# Patient Record
Sex: Female | Born: 1975 | Race: White | Hispanic: No
Health system: Midwestern US, Community
[De-identification: ages and names within clinical notes are randomized; demographics above are authoritative.]

## PROBLEM LIST (undated history)

## (undated) DIAGNOSIS — J45909 Unspecified asthma, uncomplicated: Secondary | ICD-10-CM

## (undated) DIAGNOSIS — R569 Unspecified convulsions: Secondary | ICD-10-CM

## (undated) DIAGNOSIS — K8020 Calculus of gallbladder without cholecystitis without obstruction: Secondary | ICD-10-CM

## (undated) DIAGNOSIS — I10 Essential (primary) hypertension: Secondary | ICD-10-CM

## (undated) DIAGNOSIS — I2510 Atherosclerotic heart disease of native coronary artery without angina pectoris: Secondary | ICD-10-CM

## (undated) DIAGNOSIS — D649 Anemia, unspecified: Secondary | ICD-10-CM

## (undated) HISTORY — PX: TUBAL LIGATION: SHX77

## (undated) HISTORY — PX: CAROTID STENT: SHX1301

---

## 2013-12-21 LAB — COMPREHENSIVE PANEL
ALT: 10 U/L (ref 5–33)
AST: 13 U/L (ref 5–32)
Albumin: 4.3 G/DL (ref 3.5–5.2)
Alkaline Phosphatase: 64 U/L (ref 35–104)
Anion Gap: 11 mmol/L (ref 7–19)
BUN: 15 MG/DL (ref 6–20)
CO2: 27 mmol/L (ref 22–29)
Calcium: 9.9 MG/DL (ref 8.6–10.0)
Chloride: 99 mmol/L (ref 98–111)
Creatinine: 0.8 MG/DL (ref 0.50–0.90)
Gfr Calculated: 85 mL/min (ref 59–300)
Glucose: 109 MG/DL (ref 74–109)
Potassium: 4 mmol/L (ref 3.5–5.0)
Sodium: 137 mmol/L (ref 136–145)
Total Bilirubin: <0.2 MG/DL — ABNORMAL LOW (ref 0.3–1.2)
Total Protein: 7.7 G/DL (ref 6.6–8.7)

## 2013-12-21 LAB — CBC PANEL ITEM
Basophils %: 0.5 % (ref 0–1)
Basophils: 0.05 10*3/uL (ref 0.0–0.20)
Eosinophils %: 0.13 10*3/uL (ref 0.0–0.60)
Eosinophils %: 1.2 % (ref 1–5)
Hematocrit: 37.3 % (ref 37–47)
Hemoglobin: 12 G/DL (ref 12.0–16.0)
Lymphocytes: 2.38 10*3/uL (ref 1.1–4.5)
Lymphocytes: 22.2 % (ref 20–40)
MCH: 27.7 PG (ref 27–31)
MCHC: 32.2 G/DL — ABNORMAL LOW (ref 33–37)
MCV: 86.1 FL (ref 81–99)
MPV: 9.3 FL — ABNORMAL LOW (ref 10.2–13.2)
Monocytes %: 8.4 % (ref 0–10)
Monocytes: 0.9 10*3/uL (ref 0.0–0.9)
Neutrophils %: 67.7 % (ref 50–70)
Neutrophils Absolute: 7.28 10*3/uL (ref 1.5–7.5)
Platelets: 299 10*3/uL (ref 130–400)
RBC: 4.33 MIL/uL (ref 4.2–5.4)
RDW: 14.1 % — ABNORMAL HIGH (ref 11.0–14.0)
WBC: 10.74 10*3/uL (ref 4.8–10.8)

## 2013-12-21 LAB — POCT TROPONIN
POC Troponin I: 0 NG/ML — ABNORMAL LOW (ref 0.000–0.08)
POC Troponin I: 0 NG/ML — ABNORMAL LOW (ref 0.000–0.08)

## 2013-12-21 LAB — PROTIME-INR
INR: 0.97 (ref 0.87–1.14)
Protime: 12.6 s (ref 11.8–14.6)

## 2013-12-21 LAB — APTT: PTT: 29.6 s (ref 25.5–37.8)

## 2014-08-06 DIAGNOSIS — N938 Other specified abnormal uterine and vaginal bleeding: Secondary | ICD-10-CM

## 2014-08-07 ENCOUNTER — Inpatient Hospital Stay
Admit: 2014-08-07 | Discharge: 2014-08-07 | Disposition: A | Discharge status: Home / Self Care | Payer: PRIVATE HEALTH INSURANCE

## 2014-08-07 LAB — CBC WITH AUTO DIFFERENTIAL
Basophils %: 0.4 % (ref 0.0–1.0)
Basophils Absolute: 0.1 10*3/uL (ref 0.00–0.20)
Eosinophils %: 1.5 % (ref 0.0–5.0)
Eosinophils Absolute: 0.2 10*3/uL (ref 0.00–0.60)
Hematocrit: 34 % — ABNORMAL LOW (ref 37.0–47.0)
Hemoglobin: 11 g/dL — ABNORMAL LOW (ref 12.0–16.0)
Lymphocytes %: 29 % (ref 20.0–40.0)
Lymphocytes Absolute: 3.3 10*3/uL (ref 1.1–4.5)
MCH: 27.5 pg (ref 27.0–31.0)
MCHC: 32.4 g/dL — ABNORMAL LOW (ref 33.0–37.0)
MCV: 85 fL (ref 81.0–99.0)
MPV: 9.9 fL (ref 7.4–10.4)
Monocytes %: 7.5 % (ref 0.0–10.0)
Monocytes Absolute: 0.9 10*3/uL (ref 0.00–0.90)
Neutrophils %: 61.6 % (ref 50.0–65.0)
Neutrophils Absolute: 6.9 10*3/uL (ref 1.5–7.5)
Platelets: 316 10*3/uL (ref 130–400)
RBC: 4 M/uL — ABNORMAL LOW (ref 4.20–5.40)
RDW: 13.7 % (ref 11.5–14.5)
WBC: 11.3 10*3/uL — ABNORMAL HIGH (ref 4.8–10.8)

## 2014-08-07 LAB — COMPREHENSIVE METABOLIC PANEL
ALT: 10 U/L (ref 5–33)
AST: 17 U/L (ref 5–32)
Albumin: 3.9 g/dL (ref 3.5–5.2)
Alkaline Phosphatase: 66 U/L (ref 35–104)
Anion Gap: 12 mmol/L (ref 7–19)
BUN: 13 mg/dL (ref 6–20)
CO2: 25 mmol/L (ref 22–29)
Calcium: 8.9 mg/dL (ref 8.6–10.0)
Chloride: 104 mmol/L (ref 98–111)
Creatinine: 0.8 mg/dL (ref 0.5–0.9)
GFR Non-African American: >60 (ref >60)
Globulin: 3.5 g/dL
Glucose: 112 mg/dL — ABNORMAL HIGH (ref 74–109)
Potassium: 4.1 mmol/L (ref 3.5–5.0)
Sodium: 141 mmol/L (ref 136–145)
Total Bilirubin: <0.2 mg/dL — AB (ref 0.2–1.2)
Total Protein: 7.4 g/dL (ref 6.6–8.7)

## 2014-08-07 LAB — WET PREP, GENITAL
Clue Cells, Wet Prep: NONE SEEN
Trichomonas Prep: NONE SEEN
Yeast, Wet Prep: NONE SEEN

## 2014-08-07 LAB — KOH (SKIN,HAIR,NAILS): KOH Prep: NONE SEEN

## 2014-08-07 MED FILL — ONDANSETRON 4 MG PO TBDP: 4 MG | ORAL | Qty: 1

## 2014-08-07 MED FILL — MEDROXYPROGESTERONE ACETATE 10 MG PO TABS: 10 MG | ORAL | Qty: 1

## 2014-08-07 MED FILL — MORPHINE SULFATE (PF) 4 MG/ML IV SOLN: 4 MG/ML | INTRAVENOUS | Qty: 1

## 2014-08-07 NOTE — ED Notes (Signed)
See downtime notes    Hart RochesterCynthia G Mylon Mabey, RN  08/07/14 [phone]

## 2014-08-08 LAB — C.TRACHOMATIS N.GONORRHOEAE DNA
C. Trachomatis Amplified: NEGATIVE
N. Gonorrhoeae Amplified: NEGATIVE

## 2015-09-04 ENCOUNTER — Inpatient Hospital Stay
Admission: EM | Admit: 2015-09-04 | Discharge: 2015-09-07 | Disposition: A | Discharge status: Home / Self Care | Payer: PRIVATE HEALTH INSURANCE | Source: Other Acute Inpatient Hospital | Admitting: Psychiatry

## 2015-09-04 ENCOUNTER — Emergency Department: Admit: 2015-09-04 | Payer: PRIVATE HEALTH INSURANCE

## 2015-09-04 DIAGNOSIS — F4325 Adjustment disorder with mixed disturbance of emotions and conduct: Principal | ICD-10-CM

## 2015-09-04 LAB — COMPREHENSIVE METABOLIC PANEL
ALT: 11 U/L (ref 5–33)
AST: 15 U/L (ref 5–32)
Albumin: 4.3 g/dL (ref 3.5–5.2)
Alkaline Phosphatase: 53 U/L (ref 35–104)
Anion Gap: 16 mmol/L (ref 7–19)
BUN: 19 mg/dL (ref 6–20)
CO2: 24 mmol/L (ref 22–29)
Calcium: 9.6 mg/dL (ref 8.6–10.0)
Chloride: 102 mmol/L (ref 98–111)
Creatinine: 1 mg/dL — ABNORMAL HIGH (ref 0.5–0.9)
GFR Non-African American: >60 (ref >60)
Globulin: 3.2 g/dL
Glucose: 131 mg/dL — ABNORMAL HIGH (ref 74–109)
Potassium: 3.5 mmol/L (ref 3.5–5.0)
Sodium: 142 mmol/L (ref 136–145)
Total Bilirubin: <0.2 mg/dL — AB (ref 0.2–1.2)
Total Protein: 7.5 g/dL (ref 6.6–8.7)

## 2015-09-04 LAB — CBC
Hematocrit: 36.5 % — ABNORMAL LOW (ref 37.0–47.0)
Hemoglobin: 11.7 g/dL — ABNORMAL LOW (ref 12.0–16.0)
MCH: 27.3 pg (ref 27.0–31.0)
MCHC: 32.1 g/dL — ABNORMAL LOW (ref 33.0–37.0)
MCV: 85.1 fL (ref 81.0–99.0)
MPV: 9.7 fL (ref 7.4–10.4)
Platelets: 310 10*3/uL (ref 130–400)
RBC: 4.29 M/uL (ref 4.20–5.40)
RDW: 13.4 % (ref 11.5–14.5)
WBC: 15.3 10*3/uL — ABNORMAL HIGH (ref 4.8–10.8)

## 2015-09-04 LAB — PREGNANCY, URINE: HCG(Urine) Pregnancy Test: NEGATIVE

## 2015-09-04 LAB — URINE DRUG SCREEN
Amphetamine Screen, Urine: NEGATIVE (ref <1000)
Barbiturate Screen, Ur: NEGATIVE (ref <200)
Benzodiazepine Screen, Urine: NEGATIVE (ref <100)
Cannabinoid Scrn, Ur: POSITIVE — AB (ref <50)
Cocaine Metabolite Screen, Urine: NEGATIVE (ref <300)
Opiate Scrn, Ur: NEGATIVE (ref <300)

## 2015-09-04 LAB — ETHANOL: Ethanol Lvl: <10 mg/dL

## 2015-09-04 MED ORDER — ACETAMINOPHEN 325 MG PO TABS
325 MG | Freq: Once | ORAL | Status: AC
Start: 2015-09-04 — End: 2015-09-04
  Administered 2015-09-05: 650 mg via ORAL

## 2015-09-04 NOTE — ED Notes (Signed)
Pt was refusing admission to behavioral health unit,  Explained to pt that she was having a 72 hour hold placed, pt would be in the hospital for the full 72 hours,  If pt agreed to  Go voluntary admission,  There was a chance she might not have to be in the hospital for 72 hours, it would be up to the psychiatrist. Pt agreed and signed voluntary admission form.         Andris FlurryLaura A Aedan Geimer, RN  09/04/15 2040

## 2015-09-04 NOTE — Behavioral Health Treatment Team (Signed)
Dr.Schuhmann phoned to discuss patient and reported medications.Per Dr. Roseanne Kaufman, "hold off on medications tonite, verify in AM. Trazodone was ordered in case patient could not sleep.

## 2015-09-04 NOTE — ED Notes (Signed)
Mental health intake evaluator at bedside to see pt     Andris FlurryLaura A Yailen Zemaitis, RN  09/04/15 2017
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
## 2015-09-04 NOTE — ED Notes (Signed)
Manual BP of 145/78 reported to carla, Charity fundraiserN on [facility] health. Albin FellingCarla to send a tech for pt transfer.     Nadean CorwinJessica Camry Robello, RN  09/04/15 2159

## 2015-09-04 NOTE — ED Notes (Signed)
Lana with BH notified of intake     Juleen ChinaHeather D Lougenia Morrissey  09/04/15 1903
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
## 2015-09-04 NOTE — ED Notes (Signed)
Called [facility] to report manual BP of 150/92. Informed Dr would not admit until diastolic pressure is under 80.     Nadean CorwinJessica Samiksha Pellicano, RN  09/04/15 2129

## 2015-09-04 NOTE — ED Notes (Signed)
Bed: RME02  Expected date:   Expected time:   Means of arrival:   Comments:  ems     Andris Flurry, RN  09/04/15 1705
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
## 2015-09-05 MED ORDER — IPRATROPIUM-ALBUTEROL 20-100 MCG/ACT IN AERS
20-100 MCG/ACT | RESPIRATORY_TRACT | Status: DC | PRN
Start: 2015-09-05 — End: 2015-09-07

## 2015-09-05 MED ORDER — TRAZODONE HCL 50 MG PO TABS
50 MG | Freq: Once | ORAL | Status: AC | PRN
Start: 2015-09-05 — End: 2015-09-05
  Administered 2015-09-06: 02:00:00 50 mg via ORAL

## 2015-09-05 MED ORDER — IBUPROFEN 400 MG PO TABS
400 MG | Freq: Three times a day (TID) | ORAL | Status: DC | PRN
Start: 2015-09-05 — End: 2015-09-07
  Administered 2015-09-05: 15:00:00 400 mg via ORAL

## 2015-09-05 MED ORDER — LISINOPRIL 20 MG PO TABS
20 MG | Freq: Every day | ORAL | Status: DC
Start: 2015-09-05 — End: 2015-09-07
  Administered 2015-09-05 – 2015-09-07 (×3): 20 mg via ORAL

## 2015-09-05 MED ORDER — DICLOFENAC 18 MG PO CAPS
18 MG | Freq: Two times a day (BID) | ORAL | Status: DC | PRN
Start: 2015-09-05 — End: 2015-09-05

## 2015-09-05 MED ORDER — FERROUS SULFATE 325 (65 FE) MG PO TABS
325 (65 Fe) MG | Freq: Two times a day (BID) | ORAL | Status: DC
Start: 2015-09-05 — End: 2015-09-07
  Administered 2015-09-05 – 2015-09-07 (×5): 325 mg via ORAL

## 2015-09-05 MED ORDER — CLONIDINE HCL 0.1 MG PO TABS
0.1 MG | Freq: Once | ORAL | Status: AC
Start: 2015-09-05 — End: 2015-09-04
  Administered 2015-09-05: 02:00:00 0.1 mg via ORAL

## 2015-09-05 MED ORDER — MONTELUKAST SODIUM 10 MG PO TABS
10 MG | Freq: Every evening | ORAL | Status: DC
Start: 2015-09-05 — End: 2015-09-07
  Administered 2015-09-06 – 2015-09-07 (×2): 10 mg via ORAL

## 2015-09-05 MED FILL — FERROUS SULFATE 325 (65 FE) MG PO TABS: 325 (65 Fe) MG | ORAL | Qty: 1

## 2015-09-05 MED FILL — COMBIVENT RESPIMAT 20-100 MCG/ACT IN AERS: 20-100 MCG/ACT | RESPIRATORY_TRACT | Qty: 200

## 2015-09-05 MED FILL — LISINOPRIL 20 MG PO TABS: 20 MG | ORAL | Qty: 1

## 2015-09-05 MED FILL — IBUPROFEN 400 MG PO TABS: 400 MG | ORAL | Qty: 1

## 2015-09-05 MED FILL — TYLENOL 325 MG PO TABS: 325 MG | ORAL | Qty: 2

## 2015-09-05 MED FILL — CLONIDINE HCL 0.1 MG PO TABS: 0.1 MG | ORAL | Qty: 1

## 2015-09-05 NOTE — Behavioral Health Treatment Team (Signed)
Group Therapy Note    Date: 09/05/15  Start Time: 1545   End Time:1615    Number of Participants: 14    Type of Group: Med Education   Patient's Goal:  To learn about medications and  Compliance.      Notes:      Status After Intervention:  Unchanged    Participation Level: Active Listener    Participation Quality: Appropriate    Speech:  normal    Thought Process/Content: Logical    Affective Functioning: Congruent    Mood: elevated    Level of consciousness:  Alert    Response to Learning: Able to verbalize current knowledge/experience    Modes of Intervention: Education and Support    Discipline Responsible: Registered Nurse    Signature:  Regana Kemple D Diva Lemberger, RN

## 2015-09-05 NOTE — H&P (Signed)
HISTORY and PHYSICAL      CHIEF COMPLAINT:  depression    Reason for Admission:  depression    History Obtained From:  patient    HISTORY OF PRESENT ILLNESS:      The patient is a 40 y.o. female who [facility] with worsening mood issues. She has no c/o CP or SOA. No abdominal pain or N/V. No dysuria. No recent illnesses or fevers. No c/o HA.     Past Medical History:        Diagnosis Date   ??? Asthma    ??? Hypertension      Past Surgical History:        Procedure Laterality Date   ??? CESAREAN SECTION           Medications Prior to Admission:    Prescriptions Prior to Admission: ferrous sulfate 325 (65 FE) MG tablet, Take 325 mg by mouth 2 times daily (with meals)  albuterol-ipratropium (COMBIVENT) 18-103 MCG/ACT inhaler, Inhale 2 puffs into the lungs every 4 hours as needed for Wheezing  lisinopril (PRINIVIL;ZESTRIL) 20 MG tablet, Take 20 mg by mouth daily verified  montelukast (SINGULAIR) 10 MG tablet, Take 10 mg by mouth nightly  DICLOFENAC PO, Take 75 mg by mouth 2 times daily as needed     Allergies:  Lortab [hydrocodone-acetaminophen]    Social History:   TOBACCO:   reports that she has never smoked. She does not have any smokeless tobacco history on file.  ETOH:   reports that she does not drink alcohol.  DRUGS:   reports that she does not use illicit drugs.  MARITAL STATUS:  engaged  OCCUPATION:  Not working  Patient currently lives with family       Family History:   History reviewed. No pertinent family history.  REVIEW OF SYSTEMS:  Constitutional: neg  CV: neg  Pulmonary: neg  GI: neg  GU: neg  Psych: depression, poor sleep  Neuro: neg  Skin: neg  MusculoSkeletal: neg  HEENT: neg  Joints: neg    Vitals:  BP 125/77   Pulse 82   Temp 97.7 ??F (36.5 ??C) (Temporal)    Resp 18   Ht 5' (1.524 m)   Wt 174 lb 9.6 oz (79.2 kg)   LMP 08/21/2015   SpO2 100%   BMI 34.1 kg/m2    PHYSICAL EXAM:  Gen: NAD, alert  HEENT: WNL  Lymph: no LAD  Neck: no  JVD or masses  Chest: CTA bilat  CV: RRR  Abdomen: NT/ND  Extrem: no C/C/E  Neuro: non focal  Skin: no rashes  Joints: no redness    DATA:  I have reviewed the admission labs and imaging tests.    ASSESSMENT AND PLAN:      Patient Active Hospital Problem List:  Depression----follow with Psych  HTN---monitor BP  Asthma---prn meds  Hyperglycemia--check HgA1C  THC Use  Leukocytosis          Danny LawlessAribbe A Berneda Piccininni, MD  9:48 PM 09/05/2015

## 2015-09-05 NOTE — Progress Notes (Signed)
Admission Note      Reason for admission/Target Symptom:  Diagnoses:  UDS: Negative- Benzo- Opiate- Amphetamines- THC- Cocaine- Barbs  BAL: Negative     SW met with treatment team to discuss patient treatment and follow up care plans. Pt was admitted to [facility].     Treatment team has  identified patients discharge needs as medication management and therapy with [facility]. Pt validated need for appointments. Pt was also provided a handout of contact information for drug and alcohol treatment centers and other community support service such as [facility] and [facility] .

## 2015-09-05 NOTE — Plan of Care (Signed)
Problem: Anxiety:  Goal: Level of anxiety will decrease  Level of anxiety will decrease   Outcome: Ongoing                                                                      Group Therapy Note     Date: 09/05/2015  Start Time: 1000  End Time:  1045  Number of Participants: 17     Type of Group: Psychotherapy     Patient's Goal: Patient will process emotions and actions and how to relate to other or their with others. Patient discussed open communication and feelings and emotions.     Notes:  Patient attended process group as scheduled, patient identified a issue to work on today regarding how they will interact and relate to others.      Status After Intervention:  Improved     Participation Level: Active Listener     Participation Quality: Appropriate, Attentive and Sharing        Speech:  normal        Thought Process/Content: Logical        Affective Functioning: Congruent        Mood: euthymic        Level of consciousness:  Alert        Response to Learning: Able to verbalize current knowledge/experience        Endings: None Reported     Modes of Intervention: Education, Support and Socialization        Discipline Responsible: Social Worker/Counselor        Signature:  Lovina Reach, [facility]

## 2015-09-05 NOTE — Progress Notes (Signed)
Treatment Team Note:    CSW met with [place] team to discuss Pts [place] plans.     Progress/Behavior/Group Attendance: TBD    Target Symptoms/Reason for admission: SI    Diagnoses: Depression    UDS: Neg- Benzo-Opiate- Amphetamines- THC-Cocaine- Barbs     BAL: Neg    AftercarePlan: [place] lives with: SW will meet with pt to gather information.    Collateral obtained from: SW will meet with pt to gather release of information.  On:    Family Session: TBA    Misc:

## 2015-09-05 NOTE — Behavioral Health Treatment Team (Signed)
PT OUT OF ROOM AND SOCIALIZING WITH PEERS.  REPORTS 'NO SLEEP AT ALL LAST NIGHT."   STATES SHE "OK, JUST SAD AND STRESSED OUT."  PT DOES HAVE BUMP AT THE TOP OF HER FOREHEAD.  REQUESTED IBUPROFEN FOR HEADACHE.  RATED 3/10 FOR DEPRESSION AND 0/10 FOR ANXIETY.  COMPLIANT WITH MEDS, GROUPS, AND ADL'S.  WILL CONTINUE TO MONITOR FOR SAFETY.

## 2015-09-05 NOTE — Plan of Care (Signed)
Problem: Altered Mood, Depressive Behavior  Goal: STG-Knowledge of positive coping patterns  Outcome: Ongoing                                                                      Group Therapy Note     Date: 09/05/2015  Start Time: 1500  End Time:  1545  Number of Participants: 14     Type of Group: Recovery     Wellness Binder Information  Module Name:  Relapse prevention  Session Number:  2     Patient's Goal:  Identifying early warning signs     Notes:  Pt acknowledged negative thinking as an early warning sign.     Status After Intervention:  Improved     Participation Level: Interactive     Participation Quality: Appropriate, Attentive and Sharing        Speech:  normal        Thought Process/Content: Logical        Affective Functioning: Congruent        Mood: congruent        Level of consciousness:  Alert, Oriented x4 and Attentive        Response to Learning: Able to verbalize current knowledge/experience        Endings: None Reported     Modes of Intervention: Education        Discipline Responsible: Psychoeducational Specialist        Signature:  Rona Ravens

## 2015-09-05 NOTE — Plan of Care (Signed)
Problem: Altered Mood, Depressive Behavior  Goal: STG-Knowledge of positive coping patterns  Outcome: Ongoing                                                                      Group Therapy Note     Date: 09/05/2015  Start Time: 1100  End Time:  1145  Number of Participants: 15     Type of Group: Psychoeducation     Wellness Binder Information  Module Name:  Emotional wellness  Session Number:  1     Patient's Goal:  Validation of feelings     Notes:  Pt acknowledged the way to have feelings validated is to share those feelings with others.     Status After Intervention:  Improved     Participation Level: Interactive     Participation Quality: Appropriate, Attentive and Sharing        Speech:  normal        Thought Process/Content: Logical        Affective Functioning: Congruent        Mood: congruent        Level of consciousness:  Alert, Oriented x4 and Attentive        Response to Learning: Able to verbalize current knowledge/experience        Endings: None Reported     Modes of Intervention: Education        Discipline Responsible: Psychoeducational Specialist        Signature:  Rona Ravens

## 2015-09-05 NOTE — Progress Notes (Signed)
Group Therapy Note  Start Time: 0915  End Time:  0945  Number of Participants: 16    Type of Group: Community Meeting    Patient's Goal:  Pt stated "realizing I can't help everyone".     Participation Level: Active Listener    Participation Quality: Attentive    Mood: calm    Level of consciousness:  Drowsy    Discipline Responsible: Behavioral Health Tech II    Signature:  Caleen Essex

## 2015-09-05 NOTE — Progress Notes (Signed)
Requirement Note     SW met with pt to complete Psychosocial within 72 hours, CSSRS within 24 hours, and treatment plan signature sheet within 72 hours.  SW explained treatment goals with pt. Decreasing depression and anxiety by improvement of positive coping patterns was discussed. Pt acknowledged understanding of treatment goals and signed treatment plan signature sheet.        In the last 6 months has the pt been a danger to self: YES  In the last 6 months has the pt been a danger to others: NO    Provided pt with [employer] handout entitled "Quitting Smoking."  Reviewed handout with pt addressing dangers of smoking, developing coping skills, and providing basic information about quitting.       Patient /declined practical counseling of tobacco practical counseling during the hospital stay.

## 2015-09-05 NOTE — Progress Notes (Signed)
Group Therapy Note    Date: 09/05/2015  Start Time: 2030  End Time:  2100  Number of Participants: 17    Type of Group: Wrap-Up    Wellness Binder Information  Module Name:    Session Number:      Patient's Goal:  Did not verbalize    Notes:  Educated about goals and discharge planning    Status After Intervention:  Improved    Participation Level: Active Listener    Participation Quality: Attentive      Speech:  normal      Thought Process/Content: Logical      Affective Functioning: Congruent      Mood: depressed      Level of consciousness:  Attentive      Response to Learning: Capable of insight      Endings: None Reported    Modes of Intervention: Education and Support      Discipline Responsible: Stage manager      Signature:  Sondra Come

## 2015-09-05 NOTE — H&P (Signed)
[facility]                    [address] [place], [place]  [address]                                 HISTORY AND PHYSICAL    PATIENT NAME:  POETRY, SIS.            DOB:   Jun 01, 1975  MED REC NO:    [id number]                           ROOM:  [id number]  ACCOUNT NO:    [id number]                        ADMISSION DATE: 09/04/2015  PHYSICIAN:     Elmon Kirschner, MD                  The patient is seen on 09/05/2015 at 9:50 a.m.      HISTORY OF PRESENT ILLNESS: The patient is a 40 year old divorced white  female, mother of 3, currently unemployed, who is here after she caused  self-inflicted damage by hitting her head against the concrete in a park.  The  story is that she discovered 10 days ago that her daughter who is 55 and lives  in [place], I believe, was using drugs;  specifically, cocaine and this  daughter's father kicked her out of the house.  Apparently, that daughter is  now in another place, I think in her boyfriend's mother's house and she is  safe but she was wanting the patient to go and spend some time with her and  help her and the patient and the patient's fiance were getting ready to move  there and they had a ride scheduled.  The ride did not show up and they could  not get a hold of the ride and in frustration they started arguing, they  meaning she and the fiance and the patient's oldest child who is a 74 year old  son and at some point, the patient, for some reason, started banging her head  against the concrete and the fiancee had to stop her.  Apparently, later on,  she looked pale and was not feeling well and so the boyfriend said "you have  to go to the emergency room," and he brought her.  A head CT shows no skull  fracture, nothing objectively identified in the head CT.  She denies  depression, denies suicidal ideas.  Denies hallucinations.  Reports no  psychiatric history.  No history of substance abuse.  Nevertheless, the  urine  drug screen is positive for marijuana.  She says that she does not know why  she did it, but that she will never do it again.  She is able to look at what  happened and say that she was probably frustrated to the extreme and worried  about her daughter and so on and so forth, but she very emphatically denies  suicidal ideas or any wishes to harm herself, says she has no history of  harming herself, reports no family psychiatric history.      SOCIAL HISTORY:  Born in [place]; specifically, in [place].  She has been  in [place] for 13 years.  Childhood was uneventful except she  was raised by  mother and stepfather because her father went to prison for rape, says she has  never been molested or raped.  She got out of school at the ninth grade due to  pregnancy.  Later on, she got a GED.  She goes to church off and on to the  [employer].    RESULTS:  Here include the already mentioned head CT.  Complete metabolic  profile normal, LFTs, normal.  Urine drug screen only positive for marijuana.   White blood count 15.3, H and H of 11.7 and 36.5.  Apparently, she also had a  neck CT.  No evidence of posttraumatic disk herniation.    MENTAL STATUS EXAMINATION:  Reveals a 40 year old white female with a huge  area of trauma in the middle of her forehead, in the form of swelling and so.   No apparent skin rupture or damage.  She is awake, alert, oriented,  cooperative, well related, good eye contact, normal speech, no formal thought  disorder.  No evidence of delusions or hallucinations.  No suicidal ideas,  intentions or plans.  Mood is okay.  Affect is congruent with a touch of  anxiety, but nothing unusual.  Cognition grossly intact.  Insight and judgment  good during the interview.  Impulse control good during the interview.    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    PLAN:  To admit to the unit on suicide precautions.  See Dr. Daphine Deutscher for  medical.  Obtain collateral information with proper consent.  I called the  boyfriend, but I got no response, no voicemail.  Will continue trying.  No  psychiatric medication for now.  Check [facility] report per protocol.  Estimated  length of stay 2-3 days.        Elmon Kirschner, MD      D: 09/05/2015 09:55:37  T: 09/05/2015 10:16:16  JJ/nts  Job#: [id number]  Doc#: [id number]

## 2015-09-05 NOTE — H&P (Signed)
Pt seen, H and P completed and dictated, # [id number]    J. Marga Melnick MD

## 2015-09-05 NOTE — Plan of Care (Signed)
Problem: Altered Mood, Depressive Behavior  Goal: STG-Knowledge of positive coping patterns  Outcome: Ongoing                                                                      Group Therapy Note     Date: 09/05/2015  Start Time: 1145  End Time:  1230  Number of Participants: 15     Type of Group: Physicist, medical Information  Module Name:  stress  Session Number:  1     Patient's Goal:  What causes stress     Notes:  Pt acknowledged negative thinking as a cause of stress.     Status After Intervention:  Improved     Participation Level: Interactive     Participation Quality: Appropriate, Attentive and Sharing        Speech:  normal        Thought Process/Content: Logical        Affective Functioning: Congruent        Mood: congruent        Level of consciousness:  Alert, Oriented x4 and Attentive        Response to Learning: Able to verbalize current knowledge/experience        Endings: None Reported     Modes of Intervention: Education        Discipline Responsible: Psychoeducational Specialist        Signature:  Rona Ravens

## 2015-09-06 LAB — HEMOGLOBIN A1C: Hemoglobin A1C: 5.5 %

## 2015-09-06 LAB — VITAMIN D 25 HYDROXY: Vit D, 25-Hydroxy: 10.3 ng/mL — ABNORMAL LOW (ref >=30)

## 2015-09-06 LAB — TSH: TSH: 0.89 u[IU]/mL (ref 0.27–4.20)

## 2015-09-06 LAB — VITAMIN B12: Vitamin B-12: 595 pg/mL (ref 211–946)

## 2015-09-06 MED FILL — FERROUS SULFATE 325 (65 FE) MG PO TABS: 325 (65 Fe) MG | ORAL | Qty: 1

## 2015-09-06 MED FILL — LISINOPRIL 20 MG PO TABS: 20 MG | ORAL | Qty: 1

## 2015-09-06 MED FILL — MONTELUKAST SODIUM 10 MG PO TABS: 10 MG | ORAL | Qty: 1

## 2015-09-06 MED FILL — TRAZODONE HCL 50 MG PO TABS: 50 MG | ORAL | Qty: 1

## 2015-09-06 NOTE — Progress Notes (Signed)
Alert and oriented x 4.  Denies any depression and anxiety.  Denies SI, HI, AVH.

## 2015-09-06 NOTE — Progress Notes (Signed)
PT REPORTS SHE FEELS "GOOD" TODAY AND IS FEELING MORE SOCIAL.  REPORT BETTER SLEEP AND GOOD APPETITE. DENIES SI/HI AND AVH.  RATED DEPRESSION AND ANXIETY BOTH 0/10.  COMPLIANT WITH MEDS, ADL'S AND GROUPS.  AFFECT BLUNTED WITH PLEASANT MOOD. WILL CONTINUE TO MONITOR FOR SAFETY.

## 2015-09-06 NOTE — Plan of Care (Signed)
Problem: Altered Mood, Depressive Behavior  Goal: STG-Knowledge of positive coping patterns  Outcome: Ongoing                                                                      Group Therapy Note     Date: 09/06/2015  Start Time: 1145  End Time:  1230  Number of Participants: 17     Type of Group: Physicist, medical Information  Module Name:  Social wellness  Session Number:  1     Patient's Goal:  Your support network     Notes:  Pt acknowledged family, doctor, and therapist as examples of support network.     Status After Intervention:  Improved     Participation Level: Interactive     Participation Quality: Appropriate, Attentive and Sharing        Speech:  normal        Thought Process/Content: Logical        Affective Functioning: Congruent        Mood: congruent        Level of consciousness:  Alert, Oriented x4 and Attentive        Response to Learning: Able to verbalize current knowledge/experience        Endings: None Reported     Modes of Intervention: Education        Discipline Responsible: Psychoeducational Specialist        Signature:  Rona Ravens

## 2015-09-06 NOTE — Progress Notes (Signed)
Patient alert and oriented x4.  Pleasant and cooperative with staff and peers.  Rates depression a 2/10 and anxiety a 0/10.  Med compliant, denies SI, HI and AVH

## 2015-09-06 NOTE — Progress Notes (Signed)
Placed call to get collateral from patients sister in law and there is no voice mail to leave a message [phone]

## 2015-09-06 NOTE — Progress Notes (Signed)
Group Therapy Note    Date: 08/14/2014  Start Time: 0900  End Time:  0930  Number of Participants: 16    Type of Group: Community Meeting    Patient's Goal:  Controlling my thoughts    Notes:    Patient attended group, filled out menu and goals sheet.  Continue to monitor for safety.    Status After Intervention:  Improved    Participation Level: Active Listener    Participation Quality: Appropriate    Discipline Responsible: [employer] Health Tech      Signature:  Jodi Mourning

## 2015-09-06 NOTE — Progress Notes (Signed)
Treatment Team Note:  ??  SW met with [facility] team to discuss Pts [place] plans.   ??  Progress/Behavior/Group Attendance: TBD  ??  Target Symptoms/Reason for admission: SI  ??  Diagnoses: Depression  ??  UDS: Neg- Benzo-Opiate- Amphetamines- THC-Cocaine- Barbs   ??  BAL: Neg  ??  AftercarePlan: [place]  ??  Pt lives with: SW will meet with pt to gather information.  ??  Collateral obtained from: SW will placed call and left voice mail on 09/06/15  On:  ??  Family Session: Doctor requested family meeting  ??  Misc

## 2015-09-06 NOTE — Plan of Care (Signed)
Problem: Altered Mood, Depressive Behavior  Goal: STG-Knowledge of positive coping patterns  Outcome: Ongoing                                                                      Group Therapy Note     Date: 09/06/2015  Start Time: 1100  End Time:  1145  Number of Participants: 17     Type of Group: Psychoeducation     Wellness Binder Information  Module Name:  Emotional well  Session Number:  4     Patient's Goal:  Self-Esteem     Notes:  Pt identified recognizing an being accountable for behavior as a way to improve self-esteem.     Status After Intervention:  Improved     Participation Level: Interactive     Participation Quality: Appropriate        Speech:  Congruent           Thought Process/Content: Logical        Affective Functioning: Congruent        Mood: congruent        Level of consciousness:  Alert, Oriented x4 and Attentive        Response to Learning: Able to verbalize current knowledge/experience        Endings: None Reported     Modes of Intervention: Education        Discipline Responsible: Psychoeducational Specialist        Signature:  Rona Ravens

## 2015-09-06 NOTE — Progress Notes (Signed)
09/06/2015 12:12 PM   Progress Note        Molly Nelson [birth year]  Psychotherapy Time Spent: 15 min      Psychotherapy Topics: health    Chief Complaint   Patient presents with   . Head Injury     SELF INFLICTED HEAD INJURY, STRUCK HER HEAD ON A BRICK WALL MULTIPLE TIMES TODAY, PT REPORTS WAS ANGRY, DENIES SI, HI, PT WALKED 2 MILES AFTER THE INCIDENT       Subjective:  Patient seen today. No headache, no other symptoms. No suicidal ideas, intentions or plans. No impulsivity reported or observed by the staff.    Patient reports side effects as follows: none.  Reports no suicidal ideation.  Reports compliance with medications as good .     Review of Systems - all negative    Current Meds:    Prior to Admission medications    Medication Sig Start Date End Date Taking? Authorizing Provider   ferrous sulfate 325 (65 FE) MG tablet Take 325 mg by mouth 2 times daily (with meals)   Yes Historical Provider, MD   albuterol-ipratropium (COMBIVENT) 18-103 MCG/ACT inhaler Inhale 2 puffs into the lungs every 4 hours as needed for Wheezing   Yes Historical Provider, MD   lisinopril (PRINIVIL;ZESTRIL) 20 MG tablet Take 20 mg by mouth daily verified   Yes Historical Provider, MD   montelukast (SINGULAIR) 10 MG tablet Take 10 mg by mouth nightly   Yes Historical Provider, MD   DICLOFENAC PO Take 75 mg by mouth 2 times daily as needed    Yes Historical Provider, MD       @    MSE:  Patient is  A & O x3.  Appearance:  well-appearing  Cognition:  Recent memory intact , remote memory intact , good fund of knowledge, average intelligence level.   Speech:  normal  Language: Naming: intact; Word Finding: intact  Conversation no evidence of delusions  Behavior:  Cooperative  Mood: euthymic  Affect: congruent with mood  Thought Content: negative delusions, hallucinations, obsessions, homicidal and suicidal  Thought Process: linear, goal directed and coherent  Judgement Insight:  improved and appropriate  Gait and Station:normal gait and  station   Musculoskeletal:    Assesment:   1. Self-inflicted injury        Plan:  1. The patient continues to need, on a daily basis, active treatment furnished directly by or requiring the supervision of inpatient psychiatric facility personnel.  2. Gather collateral from fiancee  3. Supportive therapy offered  4. No psych meds    Luna Fuse M.D.  @LBHPLAN @       @MEDCRED @

## 2015-09-07 MED ORDER — TRAZODONE HCL 50 MG PO TABS
50 MG | Freq: Every evening | ORAL | Status: DC | PRN
Start: 2015-09-07 — End: 2015-09-07
  Administered 2015-09-07: 02:00:00 50 mg via ORAL

## 2015-09-07 MED FILL — TRAZODONE HCL 50 MG PO TABS: 50 MG | ORAL | Qty: 1

## 2015-09-07 MED FILL — MONTELUKAST SODIUM 10 MG PO TABS: 10 MG | ORAL | Qty: 1

## 2015-09-07 MED FILL — FERROUS SULFATE 325 (65 FE) MG PO TABS: 325 (65 Fe) MG | ORAL | Qty: 1

## 2015-09-07 MED FILL — LISINOPRIL 20 MG PO TABS: 20 MG | ORAL | Qty: 1

## 2015-09-07 NOTE — Discharge Instructions (Signed)

## 2015-09-07 NOTE — Discharge Instructions (Signed)
Depression and Chronic Disease: Care Instructions  Your Care Instructions  A chronic disease is one that you have for a long time. Some chronic diseases can be controlled, but they usually cannot be cured. Depression is common in people with chronic diseases, but it often goes unnoticed.  Many people have concerns about seeking treatment for a mental health problem. You may think it's a sign of weakness, or you don't want people to know about it. It's important to overcome these reasons for not seeking treatment. Treating depression or anxiety is good for your health.  Follow-up care is a key part of your treatment and safety. Be sure to make and go to all appointments, and call your doctor if you are having problems. It's also a good idea to know your test results and keep a list of the medicines you take.  How can you care for yourself at home?  Watch for symptoms of depression  The symptoms of depression are often subtle at first. You may think they are caused by your disease rather than depression. Or you may think it is normal to be depressed when you have a chronic disease.  If you are depressed you may:   Feel sad or hopeless.   Feel guilty or worthless.   Not enjoy the things you used to enjoy.   Feel hopeless, as though life is not worth living.   Have trouble thinking or remembering.   Have low energy, and you may not eat or sleep well.   Pull away from others.   Think often about death or killing yourself. (Keep the numbers for these national suicide hotlines: 1-800-273-TALK [1-443-794-5908] and 1-800-SUICIDE [1-302-458-2071].)  Get treatment  By treating your depression, you can feel more hopeful and have more energy. If you feel better, you may take better care of yourself, so your health may improve.   Talk to your doctor if you have any changes in mood during treatment for your disease.   Ask your doctor for help. Counseling, antidepressant medicine, or a combination of the two can help  most people with depression. Often a combination works best. Counseling can also help you cope with having a chronic disease.  When should you call for help?  Call 911 anytime you think you may need emergency care. For example, call if:   You feel like hurting yourself or someone else.   Someone you know has depression and is about to attempt or is attempting suicide.  Call your doctor now or seek immediate medical care if:   You hear voices.   Someone you know has depression and:   Starts to give away his or her possessions.   Uses illegal drugs or drinks alcohol heavily.   Talks or writes about death, including writing suicide notes or talking about guns, knives, or pills.   Starts to spend a lot of time alone.   Acts very aggressively or suddenly appears calm.  Watch closely for changes in your health, and be sure to contact your doctor if:   You do not get better as expected.  Where can you learn more?  Go to [email or web address] and sign in to your MyChart account. Enter [phone] in the Search Health Information box to learn more about "Depression and Chronic Disease: Care Instructions."     If you do not have an account, please click on the "Sign Up Now" link.  Current as of: November 29, 2014  Content Version: 11.2   2006-2017 [employer].  Care instructions adapted under license by [facility]. If you have questions about a medical condition or this instruction, always ask your healthcare professional. Healthwise, Inc[employer] disclaims any warranty or liability for your use of this information.       Anxiety Disorder: Care Instructions  Your Care Instructions  Anxiety is a normal reaction to stress. Difficult situations can cause you to have symptoms such as sweaty palms and a nervous feeling.  In an anxiety disorder, the symptoms are far more severe. Constant worry, muscle tension, trouble sleeping, nausea and diarrhea, and other symptoms can make normal daily activities  difficult or impossible. These symptoms may occur for no reason, and they can affect your work, school, or social life. Medicines, counseling, and self-care can all help.  Follow-up care is a key part of your treatment and safety. Be sure to make and go to all appointments, and call your doctor if you are having problems. It's also a good idea to know your test results and keep a list of the medicines you take.  How can you care for yourself at home?   Take medicines exactly as directed. Call your doctor if you think you are having a problem with your medicine.   Go to your counseling sessions and follow-up appointments.   Recognize and accept your anxiety. Then, when you are in a situation that makes you anxious, say to yourself, "This is not an emergency. I feel uncomfortable, but I am not in danger. I can keep going even if I feel anxious."   Be kind to your body:   Relieve tension with exercise or a massage.   Get enough rest.   Avoid alcohol, caffeine, nicotine, and illegal drugs. They can increase your anxiety level and cause sleep problems.   Learn and do relaxation techniques. See below for more about these techniques.   Engage your mind. Get out and do something you enjoy. Go to a funny movie, or take a walk or hike. Plan your day. Having too much or too little to do can make you anxious.   Keep a record of your symptoms. Discuss your fears with a good friend or family member, or join a support group for people with similar problems. Talking to others sometimes relieves stress.   Get involved in social groups, or volunteer to help others. Being alone sometimes makes things seem worse than they are.   Get at least 30 minutes of exercise on most days of the week to relieve stress. Walking is a good choice. You also may want to do other activities, such as running, swimming, cycling, or playing tennis or team sports.  Relaxation techniques  Do relaxation exercises 10 to 20 minutes a day. You can  play soothing, relaxing music while you do them, if you wish.   Tell others in your house that you are going to do your relaxation exercises. Ask them not to disturb you.   Find a comfortable place, away from all distractions and noise.   Lie down on your back, or sit with your back straight.   Focus on your breathing. Make it slow and steady.   Breathe in through your nose. Breathe out through either your nose or mouth.   Breathe deeply, filling up the area between your navel and your rib cage. Breathe so that your belly goes up and down.   Do not hold your breath.   Breathe like this for 5 to 10 minutes. Notice the feeling of calmness  throughout your whole body.  As you continue to breathe slowly and deeply, relax by doing the following for another 5 to 10 minutes:   Tighten and relax each muscle group in your body. You can begin at your toes and work your way up to your head.   Imagine your muscle groups relaxing and becoming heavy.   Empty your mind of all thoughts.   Let yourself relax more and more deeply.   Become aware of the state of calmness that surrounds you.   When your relaxation time is over, you can bring yourself back to alertness by moving your fingers and toes and then your hands and feet and then stretching and moving your entire body. Sometimes people fall asleep during relaxation, but they usually wake up shortly afterward.   Always give yourself time to return to full alertness before you drive a car or do anything that might cause an accident if you are not fully alert. Never play a relaxation tape while you drive a car.  When should you call for help?  Call 911 anytime you think you may need emergency care. For example, call if:   You feel you cannot stop from hurting yourself or someone else.  Keep the numbers for these national suicide hotlines: 1-800-273-TALK 717-064-8807) and 1-800-SUICIDE (803)680-1567). If you or someone you know talks about suicide or feeling  hopeless, get help right away.  Watch closely for changes in your health, and be sure to contact your doctor if:   You have anxiety or fear that affects your life.   You have symptoms of anxiety that are new or different from those you had before.  Where can you learn more?  Go to [email or web address] and sign in to your MyChart account. Enter P754 in the Search Health Information box to learn more about "Anxiety Disorder: Care Instructions."     If you do not have an account, please click on the "Sign Up Now" link.  Current as of: November 29, 2014  Content Version: 11.2   2006-2017 [employer]. Care instructions adapted under license by [facility]. If you have questions about a medical condition or this instruction, always ask your healthcare professional. [employer] disclaims any warranty or liability for your use of this information.

## 2015-09-07 NOTE — Progress Notes (Signed)
Group Therapy Note    Start Time: 900  End Time:  930  Number of Participants: 13    Type of Group: Community Meeting       Patient's Goal:        Notes:  Patient did not fill out any goals.      Participation Level: Active Listener       Participation Quality: Appropriate      Thought Process/Content: Logical      Affective Functioning: Congruent      Mood: calm      Level of consciousness:  Alert      Modes of Intervention: Support      Discipline Responsible: Behavioral Health Tech II      Signature:  Dawn Carroll

## 2015-09-07 NOTE — Progress Notes (Signed)
Bridge appointment.  Reviewed Discharge instructions with patient and/or guardian.    Patient verbalizes understanding and agreement with the discharge plan using the [facility]  [facility] Note    Pt discharged with followings belongings:         Valuables retrieved from safe and returned to patient.  Patient left department with Departure Mode: Family member via Mobility at Departure: Ambulatory, discharged to Discharged to: Private Residence. Patient education on aftercare instructions  Patient verbalize understanding of AVS       Status and Exam  Normal: Yes  Facial Expression: Brightened  Affect: Appropriate  Level of Consciousness: Alert  Mood:Normal: Yes  Mood: Depressed  Motor Activity:Normal: Yes  Interview Behavior: Cooperative  Preception: Orient to Person, Orient to Time, Orient to Place, Orient to Situation  Attention:Normal: Yes  Attention:  (IMPROVED CONENTRATION )  Thought Processes:  (LOGICAL AND GOAL DIRECTED)  Thought Content:Normal: Yes  Thought Content:  (DENIES SUICIDAL IDEATION)  Hallucinations: None  Delusions: No  Memory:Normal: Yes  Memory:  (INTACT FOR RECENT AND REMOTE)  Insight and Judgment: No  Insight and Judgment: Poor Judgment  Present Suicidal Ideation: No  Present Homicidal Ideation: No    Anola GurneyMarcella Katriana Dortch, RN

## 2015-09-07 NOTE — Progress Notes (Signed)
Discharge Note  ??  Pt discharging on this date. Pt denies SI, HI, and AVH at this time. Pt reports improvement in behavior and is leaving unit in overall good condition. SW and pt discussed pt's follow up appointments and importance of attending appointments as scheduled, pt voiced understanding and agreement. Pt and SW also discussed pt safety plan and pt able to verbally identify: warning signs, coping strategies, places and people that help make the pt feel better/distract negative thoughts, friends/family/agencies/professionals the pt can reach out to in a crisis, and something that is important to the pt/worth living for. Pt provided the national suicide prevention hotline number 226-822-9022(1-906-159-0079) as well as local community behavioral health [facility]) crisis number for emergencies [phone](1-7075284219).     Pt to follow up with:  [facility] [facility]) on 05__/05__/17 @ 9:00 AM. [facility], patient will be seen on 05__/09__/17 @ 8:00 AM for the med management appt.     Referral to out patient tobacco cessation counseling treatment:    Patient refused tobacco cessation counseling    SW offered to assist pt with transportation, pt reports that her family will provide transporation

## 2015-09-07 NOTE — Progress Notes (Signed)
Family member met patient in 6 floor waiting room

## 2015-09-07 NOTE — Progress Notes (Signed)
Group Therapy Note    Date: 09/07/2015  Start Time: 2100  End Time:  2130  Number of Participants 17    Type of Group: Wrap-Up    Wellness Binder Information  Module Name:  0  Session Number:  0    Notes: pt attended wrap up group tonight pt stated her energy level was high appetite was good concentration was good pt stated her depression was improved as well as her anxiety the patient stated she felt like she achieve what she wanted to work on for today and worked hard and pt stated she would do a word search to help her sleep for the night .     Status After Intervention:  Unchanged    Participation Level: Active Listener    Participation Quality: Attentive      Speech:  normal      Thought Process/Content: Logical      Affective Functioning: Congruent      Mood: anxious      Level of consciousness:  Attentive       Response to Learning: Able to verbalize current knowledge/experience and Able to verbalize/acknowledge new learning      Endings: None Reported    Modes of Intervention: Education, Support, Socialization, Exploration, Clarifying, Problem-solving, Activity and Movement      Discipline Responsible: [employer]havorial Health Tech      Signature:  Kaleen OdeaAretta L Maines

## 2015-09-07 NOTE — Discharge Summary (Signed)
Discharge Summary     Patient ID:  Molly Nelson  [id number]  40 y.o.  Dec 10, 1975    Admit date: 09/04/2015  Discharge date: 09/07/2015    Admitting Physician: Judy Pimple, MD   Attending Physician: Hyman Hopes, MD  Discharge Physician: Dorothyann Gibbs     Admission Diagnoses: Self-inflicted injury [Z72.89]  Adjustment disorder [F43.20]  Adjustment disorder [F43.20]    Discharge Diagnoses: Adjustment DO    Admission Condition: poor    Discharged Condition: Improved    Indication for Admission: Self-injurious behavior    Hospital Course: Patient was admitted to the unit and oriented to it. 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 Right from the moment she was evaluated in the unit she was calm and cooperative. Denied depression, psychosis, mania, or anxiety. No psychiatric medications were started. She was cooperative, well related and participated in the groups. After 2 days she requested to be discharged and she was felt to not meet criteria for involuntary admission, so she was discharged.    Consults: Dr. Daphine Deutscher for medical    Significant Diagnostic Studies: Routine labs    Treatments: Group therapy and individual counseling    Discharge Exam:  Within normal limits    Disposition: home    Patient Instructions:   Current Discharge Medication List      CONTINUE these medications which have NOT CHANGED    Details   ferrous sulfate 325 (65 FE) MG tablet Take 325 mg by mouth 2 times daily (with meals)      albuterol-ipratropium (COMBIVENT) 18-103 MCG/ACT inhaler Inhale 2 puffs into the lungs every 4 hours as needed for Wheezing      lisinopril (PRINIVIL;ZESTRIL) 20 MG tablet Take 20 mg by mouth daily verified      montelukast (SINGULAIR) 10 MG tablet Take 10 mg by mouth nightly      DICLOFENAC PO Take 75 mg by mouth 2 times daily as needed            Activity: activity as tolerated  Diet: regular diet  Wound Care: none needed    Follow-up with per social  work.    Time worked: Less than 30 minutes    Participation:good    Electronically signed by Dorothyann Gibbs, MD on 09/07/2015 at 10:34 AM

## 2015-09-07 NOTE — Progress Notes (Addendum)
Placed call to patient fiance to get collateral and no voice mail was set up to leave a message. Placed call to [phone]

## 2015-09-07 NOTE — Progress Notes (Signed)
PATIENT IS UP AND DRESSED FOR BREAKFAST, GROUP AND MEDICATION. SHE REPORTS 8 HOURS OF SLEEP LAST NIGHT. SHE DESCRIBES HER MOOD AS " GOOD"  SHE REPORTS GOOD APPETITE AND GOOD CONCENTRATION.  SHE RATES DEPRESSION AND ANXIETY AT 0. SHE DENIES SUICIDAL AND HOMICIDAL IDEATION. SHE DENIES PSYCHOSIS. SHE IS ATTENDING AND PARTICIPATING IN GROUPS. SHE IS CALM AND COOPERATIVE. SHE IS SOCIAL WITH PEERS AND STAFF. SHE MET WITH PROVIDER AND DISCHARGE NOTED.

## 2015-09-07 NOTE — Plan of Care (Signed)
Problem: Altered Mood, Depressive Behavior  Goal: STG-Knowledge of positive coping patterns  Outcome: Ongoing                                                                      Group Therapy Note  Date:  09/06/2015  Start Time: 1500  End Time:  1600  Number of Participants: 17     Type of Group: Relapse Prevention     Wellness Binder Information  Module Name:  Fun & Achievement  Session Number:  2     Patient's Goal:  To raise awareness of the effectiveness of diversionary coping skills     Notes:  Pt participated in group discussion r/t the effectiveness of diversionary coping skills.     Status After Intervention:  Unchanged     Participation Level: Interactive     Participation Quality: Attentive        Speech:  normal        Thought Process/Content: Logical        Affective Functioning: Congruent        Mood: anxious and depressed        Level of consciousness:  Alert and Oriented x4        Response to Learning: Able to verbalize current knowledge/experience, Able to verbalize/acknowledge new learning and Progressing to goal        Endings: None Reported     Modes of Intervention: Education        Discipline Responsible: Psychoeducational Specialist        Signature:  Clydell Hakim

## 2015-09-07 NOTE — Discharge Instructions (Signed)
Resume activity as tolerated

## 2019-06-19 ENCOUNTER — Emergency Department (HOSPITAL_COMMUNITY)
Admission: EM | Admit: 2019-06-19 | Discharge: 2019-06-19 | Disposition: A | Discharge status: Home / Self Care | Payer: Self-pay | Attending: Emergency Medicine | Admitting: Emergency Medicine

## 2019-06-19 ENCOUNTER — Emergency Department (HOSPITAL_COMMUNITY): Payer: Self-pay

## 2019-06-19 ENCOUNTER — Encounter (HOSPITAL_COMMUNITY): Payer: Self-pay | Admitting: *Deleted

## 2019-06-19 ENCOUNTER — Other Ambulatory Visit: Payer: Self-pay

## 2019-06-19 DIAGNOSIS — I10 Essential (primary) hypertension: Secondary | ICD-10-CM | POA: Insufficient documentation

## 2019-06-19 DIAGNOSIS — W07XXXA Fall from chair, initial encounter: Secondary | ICD-10-CM | POA: Insufficient documentation

## 2019-06-19 DIAGNOSIS — R55 Syncope and collapse: Secondary | ICD-10-CM | POA: Insufficient documentation

## 2019-06-19 DIAGNOSIS — S0083XA Contusion of other part of head, initial encounter: Secondary | ICD-10-CM | POA: Insufficient documentation

## 2019-06-19 DIAGNOSIS — T07XXXA Unspecified multiple injuries, initial encounter: Secondary | ICD-10-CM

## 2019-06-19 DIAGNOSIS — Z79899 Other long term (current) drug therapy: Secondary | ICD-10-CM | POA: Insufficient documentation

## 2019-06-19 DIAGNOSIS — I2510 Atherosclerotic heart disease of native coronary artery without angina pectoris: Secondary | ICD-10-CM | POA: Insufficient documentation

## 2019-06-19 DIAGNOSIS — Y939 Activity, unspecified: Secondary | ICD-10-CM | POA: Insufficient documentation

## 2019-06-19 DIAGNOSIS — Y999 Unspecified external cause status: Secondary | ICD-10-CM | POA: Insufficient documentation

## 2019-06-19 DIAGNOSIS — Y929 Unspecified place or not applicable: Secondary | ICD-10-CM | POA: Insufficient documentation

## 2019-06-19 HISTORY — DX: Essential (primary) hypertension: I10

## 2019-06-19 HISTORY — DX: Atherosclerotic heart disease of native coronary artery without angina pectoris: I25.10

## 2019-06-19 LAB — CBC WITH DIFFERENTIAL/PLATELET
Abs Immature Granulocytes: 0.04 10*3/uL (ref 0.00–0.07)
Basophils Absolute: 0.1 10*3/uL (ref 0.0–0.1)
Basophils Relative: 1 %
Eosinophils Absolute: 0.2 10*3/uL (ref 0.0–0.5)
Eosinophils Relative: 2 %
HCT: 39.1 % (ref 36.0–46.0)
Hemoglobin: 12.2 g/dL (ref 12.0–15.0)
Immature Granulocytes: 0 %
Lymphocytes Relative: 18 %
Lymphs Abs: 1.8 10*3/uL (ref 0.7–4.0)
MCH: 28.3 pg (ref 26.0–34.0)
MCHC: 31.2 g/dL (ref 30.0–36.0)
MCV: 90.7 fL (ref 80.0–100.0)
Monocytes Absolute: 0.6 10*3/uL (ref 0.1–1.0)
Monocytes Relative: 6 %
Neutro Abs: 7.1 10*3/uL (ref 1.7–7.7)
Neutrophils Relative %: 73 %
Platelets: 308 10*3/uL (ref 150–400)
RBC: 4.31 MIL/uL (ref 3.87–5.11)
RDW: 13.6 % (ref 11.5–15.5)
WBC: 9.8 10*3/uL (ref 4.0–10.5)
nRBC: 0 % (ref 0.0–0.2)

## 2019-06-19 LAB — COMPREHENSIVE METABOLIC PANEL
ALT: 13 U/L (ref 0–44)
AST: 17 U/L (ref 15–41)
Albumin: 4.2 g/dL (ref 3.5–5.0)
Alkaline Phosphatase: 60 U/L (ref 38–126)
Anion gap: 9 (ref 5–15)
BUN: 18 mg/dL (ref 6–20)
CO2: 26 mmol/L (ref 22–32)
Calcium: 9.6 mg/dL (ref 8.9–10.3)
Chloride: 102 mmol/L (ref 98–111)
Creatinine, Ser: 0.75 mg/dL (ref 0.44–1.00)
GFR calc Af Amer: >60 mL/min (ref >60)
GFR calc non Af Amer: >60 mL/min (ref >60)
Glucose, Bld: 110 mg/dL — ABNORMAL HIGH (ref 70–99)
Potassium: 3.9 mmol/L (ref 3.5–5.1)
Sodium: 137 mmol/L (ref 135–145)
Total Bilirubin: 0.3 mg/dL (ref 0.3–1.2)
Total Protein: 7.8 g/dL (ref 6.5–8.1)

## 2019-06-19 LAB — RAPID URINE DRUG SCREEN, HOSP PERFORMED
Amphetamines: NOT DETECTED
Barbiturates: NOT DETECTED
Benzodiazepines: NOT DETECTED
Cocaine: NOT DETECTED
Opiates: NOT DETECTED
Tetrahydrocannabinol: POSITIVE — AB

## 2019-06-19 LAB — URINALYSIS, ROUTINE W REFLEX MICROSCOPIC
Bacteria, UA: NONE SEEN
Bilirubin Urine: NEGATIVE
Glucose, UA: NEGATIVE mg/dL
Hgb urine dipstick: NEGATIVE
Ketones, ur: NEGATIVE mg/dL
Nitrite: NEGATIVE
Protein, ur: NEGATIVE mg/dL
Specific Gravity, Urine: 1.01 (ref 1.005–1.030)
pH: 7 (ref 5.0–8.0)

## 2019-06-19 LAB — ETHANOL: Alcohol, Ethyl (B): <10 mg/dL (ref <10)

## 2019-06-19 MED ORDER — SODIUM CHLORIDE 0.9 % IV SOLN: INTRAVENOUS | Status: DC

## 2019-06-19 MED ORDER — SODIUM CHLORIDE 0.9 % IV BOLUS
500.0000 mL | Freq: Once | INTRAVENOUS | Status: AC
  Administered 2019-06-19: 500 mL via INTRAVENOUS

## 2019-06-19 NOTE — ED Provider Notes (Signed)
[facility] EMERGENCY DEPARTMENT Provider Note   CSN: [id number] Arrival date & time: 06/19/19  1910     History Chief Complaint  Patient presents with   Loss of Consciousness    Yvette [name] is a 44 y.o. female.  HPI She is here for evaluation of syncope.  She recalls cooking, feeling weak and starting to see white dots in her visual field so she went and sat down.  Her boyfriend was with her and he describes her falling out of the chair landing on her face, and being unconscious for about 45 seconds.  She presents for evaluation, by private vehicle.  She injured her face and head in the fall.  She states she has had several other episodes of syncope in the last couple weeks each preceded by a lightheaded feeling.  She has anemia and is supposed be taking iron pills but ran out of them.  She is also supposed to be on lisinopril, 40 mg each day, for hypertension but also is out of that.  She recently moved here, and now does not have a primary care in the area.  She denies recent illnesses including fever, chills, cough, shortness of breath, change in bowel or urinary habits.  There are no other known modifying factors.    Past Medical History:  Diagnosis Date   Coronary artery disease    Hypertension     There are no problems to display for this patient.   Past Surgical History:  Procedure Laterality Date   CAROTID STENT     placed in 2019     OB History   No obstetric history on file.     No family history on file.  Social History   Tobacco Use   Smoking status: Never Smoker   Smokeless tobacco: Never Used  Substance Use Topics   Alcohol use: Not Currently   Drug use: Not on file    Home Medications Prior to Admission medications   Medication Sig Start Date End Date Taking? Authorizing Provider  lisinopril (ZESTRIL) 40 MG tablet Take 40 mg by mouth daily.    [provider]    Allergies    Percocet [oxycodone-acetaminophen]  Review  of Systems   Review of Systems  All other systems reviewed and are negative.   Physical Exam Updated Vital Signs BP (!) 144/87    Pulse 85    Temp 99.5 F (37.5 C)    Resp 18    Ht 4\' 11"  (1.499 m)    Wt 78.9 kg    LMP 05/19/2019    SpO2 98%    BMI 35.14 kg/m   Physical Exam Vitals and nursing note reviewed.  Constitutional:      General: She is not in acute distress.    Appearance: She is well-developed. She is not ill-appearing, toxic-appearing or diaphoretic.  HENT:     Head: Normocephalic.     Comments: Contusion of the forehead, nose and right cheek.  No nasal bleeding at this time.  No midface crepitation or instability. Eyes:     Conjunctiva/sclera: Conjunctivae normal.     Pupils: Pupils are equal, round, and reactive to light.  Neck:     Trachea: Phonation normal.  Cardiovascular:     Rate and Rhythm: Normal rate and regular rhythm.  Pulmonary:     Effort: Pulmonary effort is normal. No respiratory distress.     Breath sounds: Normal breath sounds. No stridor.  Chest:     Chest wall: No  tenderness.  Abdominal:     General: There is no distension.     Palpations: Abdomen is soft.     Tenderness: There is no abdominal tenderness. There is no guarding.  Musculoskeletal:        General: Normal range of motion.     Cervical back: Normal range of motion and neck supple.  Skin:    General: Skin is warm and dry.  Neurological:     Mental Status: She is alert and oriented to person, place, and time.     Motor: No abnormal muscle tone.  Psychiatric:        Mood and Affect: Mood normal.        Behavior: Behavior normal.        Thought Content: Thought content normal.        Judgment: Judgment normal.     ED Results / Procedures / Treatments   Labs (all labs ordered are listed, but only abnormal results are displayed) Labs Reviewed  RAPID URINE DRUG SCREEN, HOSP PERFORMED - Abnormal; Notable for the following components:      Result Value   Tetrahydrocannabinol  POSITIVE (*)    All other components within normal limits  URINALYSIS, ROUTINE W REFLEX MICROSCOPIC - Abnormal; Notable for the following components:   Color, Urine STRAW (*)    APPearance HAZY (*)    Leukocytes,Ua TRACE (*)    All other components within normal limits  COMPREHENSIVE METABOLIC PANEL - Abnormal; Notable for the following components:   Glucose, Bld 110 (*)    All other components within normal limits  ETHANOL  CBC WITH DIFFERENTIAL/PLATELET    EKG EKG Interpretation  Date/Time:  Saturday June 19 2019 19:29:20 EST Ventricular Rate:  100 PR Interval:    QRS Duration: 88 QT Interval:  311 QTC Calculation: 401 R Axis:   72 Text Interpretation: Sinus tachycardia Baseline wander in lead(s) V3 V4 No old tracing to compare Confirmed by Mancel Bale [phone]) on 06/19/2019 7:50:55 PM   Radiology CT Head Wo Contrast  Result Date: 06/19/2019 CLINICAL DATA:  Larey Seat, facial injury, loss of consciousness EXAM: CT HEAD WITHOUT CONTRAST TECHNIQUE: Contiguous axial images were obtained from the base of the skull through the vertex without intravenous contrast. COMPARISON:  None. FINDINGS: Brain: No acute infarct or hemorrhage. Lateral ventricles and midline structures are unremarkable. No acute extra-axial fluid collections. No mass effect. Vascular: No hyperdense vessel or unexpected calcification. Skull: Normal. Negative for fracture or focal lesion. Sinuses/Orbits: Polypoid mucosal thickening left maxillary sinus. Otherwise sinuses are clear. Other: None IMPRESSION: 1. No acute intracranial process. Electronically Signed   By: Sharlet Salina M.D.   On: 06/19/2019 21:14   CT Cervical Spine Wo Contrast  Result Date: 06/19/2019 CLINICAL DATA:  Larey Seat, loss of consciousness EXAM: CT CERVICAL SPINE WITHOUT CONTRAST TECHNIQUE: Multidetector CT imaging of the cervical spine was performed without intravenous contrast. Multiplanar CT image reconstructions were also generated. COMPARISON:   None. FINDINGS: Alignment: Alignment is grossly anatomic. Skull base and vertebrae: There are no acute displaced fractures. Soft tissues and spinal canal: No prevertebral fluid or swelling. No visible canal hematoma. Disc levels: Minimal spondylosis at C4/C5 with anterior osteophyte formation. Disc space height is well preserved. Upper chest: Airways patent.  Lung apices are clear. Other: Reconstructed images demonstrate no additional findings. IMPRESSION: 1. Minimal cervical spondylosis.  No acute bony abnormality. Electronically Signed   By: Sharlet Salina M.D.   On: 06/19/2019 21:15   DG Chest [facility] [address]  Result Date: 06/19/2019 CLINICAL DATA:  Syncope EXAM: PORTABLE CHEST 1 VIEW COMPARISON:  None. FINDINGS: Mild irregular nodular opacity in the right lower lobe, possibly reflecting focal scarring or infection. Left lung is essentially clear. No pleural effusion or pneumothorax. The heart is normal in size. IMPRESSION: Mild irregular nodular opacity in the right lower lobe, possibly reflecting focal scarring or infection. At a minimum, follow-up PA/lateral chest radiographs are suggested in 4-6 weeks to document resolution. If persistent, consider CT chest. Electronically Signed   By: Charline Bills M.D.   On: 06/19/2019 20:06   CT Maxillofacial WO CM  Result Date: 06/19/2019 CLINICAL DATA:  Facial trauma, fell, loss of consciousness EXAM: CT MAXILLOFACIAL WITHOUT CONTRAST TECHNIQUE: Multidetector CT imaging of the maxillofacial structures was performed. Multiplanar CT image reconstructions were also generated. COMPARISON:  None. FINDINGS: Osseous: No acute facial bone fractures. Orbits: Negative. No traumatic or inflammatory finding. Sinuses: There is polypoid mucosal thickening within the bilateral maxillary sinuses, left greater than right. Remaining sinuses are clear. Soft tissues: Negative. Limited intracranial: No significant or unexpected finding. Reconstructed images demonstrate no additional  findings. IMPRESSION: 1. No acute facial bone fracture. 2. Bilateral maxillary sinus disease. Electronically Signed   By: Sharlet Salina M.D.   On: 06/19/2019 21:17    Procedures Procedures (including critical care time)  Medications Ordered in ED Medications  0.9 %  sodium chloride infusion ( Intravenous New Bag/Given 06/19/19 1939)  sodium chloride 0.9 % bolus 500 mL (0 mLs Intravenous Stopped 06/19/19 2008)    ED Course  I have reviewed the triage vital signs and the nursing notes.  Pertinent labs & imaging results that were available during my care of the patient were reviewed by me and considered in my medical decision making (see chart for details).  Clinical Course as of Jun 18 2234  Sat Jun 19, 2019  2205 Normal except presence of [facility]  Urine rapid drug screen (hosp performed)(!) [EW]  2206 Normal  Ethanol [EW]  2206 Normal except glucose elevated  Comprehensive metabolic panel(!) [EW]  2206 Normal except trace leukocytes  Urinalysis, Routine w reflex microscopic(!) [EW]  2206 Normal  CBC with Differential [EW]  2214 No infiltrate or CHF, interpreted by me  DG Chest [facility] [EW]  2214 CT images of head, cervical spine, maxillofacial structures; interpreted by radiology, no acute injury.   [EW]    Clinical Course User Index [EW] Mancel Bale, MD   MDM Rules/Calculators/A&P                       Patient Vitals for the past 24 hrs:  BP Temp Temp src Pulse Resp SpO2 Height Weight  06/19/19 2200 (!) 144/87 -- -- 85 18 98 % -- --  06/19/19 2138 (!) 149/84 -- -- 88 16 97 % -- --  06/19/19 2030 (!) 177/113 -- -- (!) 104 (!) 21 100 % -- --  06/19/19 2029 (!) 167/117 99.5 F (37.5 C) -- (!) 102 (!) 23 -- -- --  06/19/19 1938 -- -- -- 96 (!) 21 100 % -- --  06/19/19 1928 (!) 137/125 -- -- -- -- -- -- --  06/19/19 1918 (!) 151/106 -- -- (!) 114 18 -- -- --  06/19/19 1915 (!) 181/113 98.8 F (37.1 C) Oral (!) 123 20 100 % 4\' 11"  (1.499 m) 78.9 kg    10:13 PM  Reevaluation with update and discussion. After initial assessment and treatment, an updated evaluation reveals no change  in clinical status, she is alert and comfortable.  She states she is not currently working but has a job interview, next week.  Findings discussed and questions answered.Mancel Bale   Medical Decision Making: Tresa Endo, without clear cause.  No serious injury.  No significant provoking abnormality found.  Patient stable for discharge with outpatient management.  She does not currently have a PCP so will be referred to 1 as an initial point of contact with the medical care system.  Doubt ACS, PE or pneumonia.  Doubt metabolic instability or impending vascular collapse.  Blood pressure improved at discharge.  CRITICAL CARE-no Performed by: Mancel Bale  Nursing Notes Reviewed/ Care Coordinated Applicable Imaging Reviewed Interpretation of Laboratory Data incorporated into ED treatment  The patient appears reasonably screened and/or stabilized for discharge and I doubt any other medical condition or other [facility] requiring further screening, evaluation, or treatment in the ED at this time prior to discharge.  Plan: Home Medications-continue usual; Home Treatments-cryotherapy advanced heat therapy; return here if the recommended treatment, does not improve the symptoms; Recommended follow up-PCP of choice for further evaluation treatment soon as possible.   Final Clinical Impression(s) / ED Diagnoses Final diagnoses:  Syncope and collapse  Contusion, multiple sites    Rx / DC Orders ED Discharge Orders    None       Mancel Bale, MD 06/19/19 2335

## 2019-06-19 NOTE — ED Triage Notes (Signed)
Spouse reports that pt was cooking when he noticed she had a blank stare on her face, became pale, sat down in a chair and then fell to the floor. Spouse reports that pt was unconscious for about 30-45 seconds. Pt states that this has happened several times recently

## 2019-06-19 NOTE — ED Notes (Signed)
Pt reports that she has been out of her blood pressure medication for a month due to recent move to the area

## 2019-06-19 NOTE — Discharge Instructions (Addendum)
It is not clear why you passed out.  There are no signs of serious injury, or problems like stroke.  Use the resource guide, attached to this document to help you find a doctor to see for further evaluation and treatment, soon as possible.  If you need to you can always return here for further assessment and treatment.

## 2020-01-04 ENCOUNTER — Ambulatory Visit
Admission: EM | Admit: 2020-01-04 | Discharge: 2020-01-04 | Disposition: A | Discharge status: Home / Self Care | Payer: Self-pay | Attending: Emergency Medicine | Admitting: Emergency Medicine

## 2020-01-04 ENCOUNTER — Other Ambulatory Visit: Payer: Self-pay

## 2020-01-04 NOTE — ED Triage Notes (Signed)
Patient is being discharged from the Urgent Care and sent to the Emergency Department via private vehicle  . Per B Wurst , patient is in need of higher level of care due to near syncope elevated BP. Patient is aware and verbalizes understanding of plan of care.  Vitals:   01/04/20 1822  BP: (!) 170/96  Pulse: (!) 109  Resp: 17  Temp: 98.9 F (37.2 C)  SpO2: 97%

## 2020-01-04 NOTE — ED Triage Notes (Signed)
Pt presents with near syncope event, has been out of BP meds for 2 months

## 2020-01-06 ENCOUNTER — Other Ambulatory Visit: Payer: Self-pay

## 2020-01-06 ENCOUNTER — Emergency Department (HOSPITAL_COMMUNITY): Payer: Self-pay

## 2020-01-06 ENCOUNTER — Emergency Department (HOSPITAL_COMMUNITY)
Admission: EM | Admit: 2020-01-06 | Discharge: 2020-01-06 | Disposition: A | Discharge status: Home / Self Care | Payer: Self-pay | Attending: Emergency Medicine | Admitting: Emergency Medicine

## 2020-01-06 ENCOUNTER — Encounter (HOSPITAL_COMMUNITY): Payer: Self-pay | Admitting: *Deleted

## 2020-01-06 DIAGNOSIS — I2510 Atherosclerotic heart disease of native coronary artery without angina pectoris: Secondary | ICD-10-CM | POA: Insufficient documentation

## 2020-01-06 DIAGNOSIS — I10 Essential (primary) hypertension: Secondary | ICD-10-CM | POA: Insufficient documentation

## 2020-01-06 DIAGNOSIS — R42 Dizziness and giddiness: Secondary | ICD-10-CM | POA: Insufficient documentation

## 2020-01-06 DIAGNOSIS — Z79899 Other long term (current) drug therapy: Secondary | ICD-10-CM | POA: Insufficient documentation

## 2020-01-06 LAB — URINALYSIS, ROUTINE W REFLEX MICROSCOPIC
Bacteria, UA: NONE SEEN
Bilirubin Urine: NEGATIVE
Glucose, UA: NEGATIVE mg/dL
Hgb urine dipstick: NEGATIVE
Ketones, ur: 20 mg/dL — AB
Leukocytes,Ua: NEGATIVE
Nitrite: NEGATIVE
Protein, ur: 30 mg/dL — AB
Specific Gravity, Urine: 1.021 (ref 1.005–1.030)
pH: 5 (ref 5.0–8.0)

## 2020-01-06 LAB — CBC
HCT: 43.3 % (ref 36.0–46.0)
Hemoglobin: 13.8 g/dL (ref 12.0–15.0)
MCH: 28.8 pg (ref 26.0–34.0)
MCHC: 31.9 g/dL (ref 30.0–36.0)
MCV: 90.4 fL (ref 80.0–100.0)
Platelets: 325 10*3/uL (ref 150–400)
RBC: 4.79 MIL/uL (ref 3.87–5.11)
RDW: 13.4 % (ref 11.5–15.5)
WBC: 12.8 10*3/uL — ABNORMAL HIGH (ref 4.0–10.5)
nRBC: 0 % (ref 0.0–0.2)

## 2020-01-06 LAB — CBG MONITORING, ED: Glucose-Capillary: 115 mg/dL — ABNORMAL HIGH (ref 70–99)

## 2020-01-06 LAB — BASIC METABOLIC PANEL
Anion gap: 11 (ref 5–15)
BUN: 19 mg/dL (ref 6–20)
CO2: 22 mmol/L (ref 22–32)
Calcium: 8.8 mg/dL — ABNORMAL LOW (ref 8.9–10.3)
Chloride: 98 mmol/L (ref 98–111)
Creatinine, Ser: 0.77 mg/dL (ref 0.44–1.00)
GFR calc Af Amer: >60 mL/min (ref >60)
GFR calc non Af Amer: >60 mL/min (ref >60)
Glucose, Bld: 123 mg/dL — ABNORMAL HIGH (ref 70–99)
Potassium: 4.2 mmol/L (ref 3.5–5.1)
Sodium: 131 mmol/L — ABNORMAL LOW (ref 135–145)

## 2020-01-06 LAB — POC URINE PREG, ED: Preg Test, Ur: NEGATIVE

## 2020-01-06 MED ORDER — LISINOPRIL 40 MG PO TABS: 40.0000 mg | ORAL_TABLET | Freq: Every day | ORAL | 1 refills | Status: DC

## 2020-01-06 NOTE — ED Notes (Signed)
Checked orthostatic vitals. Pt c/o dizziness/lightheadedness and seeing white spots when getting standing reading. EDP aware.

## 2020-01-06 NOTE — ED Provider Notes (Signed)
[facility] EMERGENCY DEPARTMENT Provider Note   CSN: [id number] Arrival date & time: 01/06/20  1529     History Chief Complaint  Patient presents with  . Dizziness    Yvette [name] is a 44 y.o. female.  Patient is a 44 year old female with history of hypertension previously on lisinopril, but off all medications for the past several months due to insurance reasons.  Patient presents today for evaluation of near syncope and episodes of intermittent confusion.  Patient reports these episodes ongoing for the past 2 weeks, however describes 4 episodes today.  Patient tells me that the right side of her face draws up and she experiences dizziness/lightheadedness.  During these episodes, her husband tells her that she appears to not hear what he is saying or interact with him.  Patient denies any fevers or chills.  She denies palpitations or loss of consciousness.  She was seen at urgent care, then sent here for further evaluation.  The history is provided by the patient.       Past Medical History:  Diagnosis Date  . Coronary artery disease   . Hypertension     There are no problems to display for this patient.   Past Surgical History:  Procedure Laterality Date  . CAROTID STENT     placed in 2019     OB History   No obstetric history on file.     Family History  Family history unknown: Yes    Social History   Tobacco Use  . Smoking status: Never Smoker  . Smokeless tobacco: Never Used  Substance Use Topics  . Alcohol use: Not Currently  . Drug use: Not on file    Home Medications Prior to Admission medications   Medication Sig Start Date End Date Taking? Authorizing Provider  lisinopril (ZESTRIL) 40 MG tablet Take 40 mg by mouth daily.    [provider]    Allergies    Percocet [oxycodone-acetaminophen]  Review of Systems   Review of Systems  All other systems reviewed and are negative.   Physical Exam Updated Vital Signs BP (!)  168/89   Pulse 94   Temp 98.6 F (37 C) (Oral)   Resp 18   Ht 4\' 9"  (1.448 m)   Wt 77.6 kg   LMP  (LMP Unknown)   SpO2 100%   BMI 37.00 kg/m   Physical Exam Vitals and nursing note reviewed.  Constitutional:      General: She is not in acute distress.    Appearance: She is well-developed. She is not diaphoretic.  HENT:     Head: Normocephalic and atraumatic.  Eyes:     Extraocular Movements: Extraocular movements intact.     Pupils: Pupils are equal, round, and reactive to light.  Cardiovascular:     Rate and Rhythm: Normal rate and regular rhythm.     Heart sounds: No murmur heard.  No friction rub. No gallop.   Pulmonary:     Effort: Pulmonary effort is normal. No respiratory distress.     Breath sounds: Normal breath sounds. No wheezing.  Abdominal:     General: Bowel sounds are normal. There is no distension.     Palpations: Abdomen is soft.     Tenderness: There is no abdominal tenderness.  Musculoskeletal:        General: Normal range of motion.     Cervical back: Normal range of motion and neck supple.  Skin:    General: Skin is warm and dry.  Neurological:     General: No focal deficit present.     Mental Status: She is alert and oriented to person, place, and time.     Cranial Nerves: No cranial nerve deficit.     Motor: No weakness.     Coordination: Coordination normal.     Gait: Gait normal.     ED Results / Procedures / Treatments   Labs (all labs ordered are listed, but only abnormal results are displayed) Labs Reviewed  CBC - Abnormal; Notable for the following components:      Result Value   WBC 12.8 (*)    All other components within normal limits  CBG MONITORING, ED - Abnormal; Notable for the following components:   Glucose-Capillary 115 (*)    All other components within normal limits  BASIC METABOLIC PANEL  URINALYSIS, ROUTINE W REFLEX MICROSCOPIC  POC URINE PREG, ED    EKG EKG Interpretation  Date/Time:  Thursday January 06 2020 16:00:13 EDT Ventricular Rate:  111 PR Interval:  120 QRS Duration: 78 QT Interval:  324 QTC Calculation: 440 R Axis:   64 Text Interpretation: Sinus tachycardia Otherwise normal ECG Confirmed by Geoffery Lyons ([id number]) on 01/06/2020 4:11:19 PM   Radiology No results found.  Procedures Procedures (including critical care time)  Medications Ordered in ED Medications - No data to display  ED Course  I have reviewed the triage vital signs and the nursing notes.  Pertinent labs & imaging results that were available during my care of the patient were reviewed by me and considered in my medical decision making (see chart for details).    MDM Rules/Calculators/A&P  Patient presenting here with complaints of dizziness as described in the HPI.  Patient's vitals are stable and physical examination is nonfocal.  Her laboratory studies are all unremarkable.  At this point, I am uncertain as to the etiology of these episodes, but nothing today appears emergent.  Patient has been off of her lisinopril for the past several months and this will be restarted.  Her blood pressure is somewhat high, but I doubt the cause of her issues.  Patient to be discharged with her antihypertensives and follow-up with neurology to discuss these episodes.  Final Clinical Impression(s) / ED Diagnoses Final diagnoses:  None    Rx / DC Orders ED Discharge Orders    None       Geoffery Lyons, MD 01/06/20 2309

## 2020-01-06 NOTE — Discharge Instructions (Signed)
Begin taking your lisinopril as before.  Follow-up with neurology in the next week.  The contact information for Dr. Gerilyn Pilgrim has been provided in this discharge summary for you to call and make these arrangements.

## 2020-01-06 NOTE — ED Notes (Signed)
This nurse spoke to the pt to make sure everything was ok. I asked the patient if she had passed out in the waiting room as her daughter had called and reported. The pt states she passed out prior to arriving to the ED and she was fine at the moment. NAD observed, pt appears stable at this time.

## 2020-01-06 NOTE — ED Notes (Addendum)
Patient transported to CT 

## 2020-05-25 ENCOUNTER — Other Ambulatory Visit: Payer: Self-pay

## 2020-05-25 ENCOUNTER — Encounter (HOSPITAL_COMMUNITY): Payer: Self-pay

## 2020-05-25 ENCOUNTER — Emergency Department (HOSPITAL_COMMUNITY): Payer: Self-pay

## 2020-05-25 ENCOUNTER — Emergency Department (HOSPITAL_COMMUNITY)
Admission: EM | Admit: 2020-05-25 | Discharge: 2020-05-25 | Disposition: A | Discharge status: Home / Self Care | Payer: Self-pay | Attending: Emergency Medicine | Admitting: Emergency Medicine

## 2020-05-25 DIAGNOSIS — J45909 Unspecified asthma, uncomplicated: Secondary | ICD-10-CM | POA: Insufficient documentation

## 2020-05-25 DIAGNOSIS — W541XXA Struck by dog, initial encounter: Secondary | ICD-10-CM | POA: Insufficient documentation

## 2020-05-25 DIAGNOSIS — Z760 Encounter for issue of repeat prescription: Secondary | ICD-10-CM

## 2020-05-25 DIAGNOSIS — I119 Hypertensive heart disease without heart failure: Secondary | ICD-10-CM | POA: Insufficient documentation

## 2020-05-25 DIAGNOSIS — Y93K1 Activity, walking an animal: Secondary | ICD-10-CM | POA: Insufficient documentation

## 2020-05-25 DIAGNOSIS — S63641A Sprain of metacarpophalangeal joint of right thumb, initial encounter: Secondary | ICD-10-CM | POA: Insufficient documentation

## 2020-05-25 DIAGNOSIS — I2510 Atherosclerotic heart disease of native coronary artery without angina pectoris: Secondary | ICD-10-CM | POA: Insufficient documentation

## 2020-05-25 DIAGNOSIS — Z79899 Other long term (current) drug therapy: Secondary | ICD-10-CM | POA: Insufficient documentation

## 2020-05-25 DIAGNOSIS — I10 Essential (primary) hypertension: Secondary | ICD-10-CM

## 2020-05-25 HISTORY — DX: Anemia, unspecified: D64.9

## 2020-05-25 HISTORY — DX: Unspecified asthma, uncomplicated: J45.909

## 2020-05-25 MED ORDER — IBUPROFEN 800 MG PO TABS
800.0000 mg | ORAL_TABLET | Freq: Once | ORAL | Status: AC
  Administered 2020-05-25: 800 mg via ORAL
  Filled 2020-05-25: qty 1

## 2020-05-25 MED ORDER — LISINOPRIL 40 MG PO TABS: 40.0000 mg | ORAL_TABLET | Freq: Every day | ORAL | 1 refills | Status: DC

## 2020-05-25 MED ORDER — TRAMADOL HCL 50 MG PO TABS
50.0000 mg | ORAL_TABLET | Freq: Once | ORAL | Status: AC
  Administered 2020-05-25: 50 mg via ORAL
  Filled 2020-05-25: qty 1

## 2020-05-25 MED ORDER — TRAMADOL HCL 50 MG PO TABS: 50.0000 mg | ORAL_TABLET | Freq: Four times a day (QID) | ORAL | 0 refills | Status: DC | PRN

## 2020-05-25 NOTE — ED Triage Notes (Signed)
Pt reports slipped while walking her dog today and hyperextended her r thumb.  C/O pain to r thumb.  Reports she did not fall.

## 2020-05-25 NOTE — ED Provider Notes (Signed)
[facility] EMERGENCY DEPARTMENT Provider Note   CSN: [id number] Arrival date & time: 05/25/20  9675     History Chief Complaint  Patient presents with  . thumb pain    Yvette [name] is a 45 y.o. female.  Pt presents to the ED today with right thumb pain.  She said she slipped while walking her dog and her right thumb bent backwards.  She denies falling.        Past Medical History:  Diagnosis Date  . Anemia   . Asthma   . Coronary artery disease   . Hypertension     There are no problems to display for this patient.   Past Surgical History:  Procedure Laterality Date  . CAROTID STENT     placed in 2019  . TUBAL LIGATION       OB History   No obstetric history on file.     Family History  Family history unknown: Yes    Social History   Tobacco Use  . Smoking status: Never Smoker  . Smokeless tobacco: Never Used  Substance Use Topics  . Alcohol use: Not Currently    Home Medications Prior to Admission medications   Medication Sig Start Date End Date Taking? Authorizing Provider  traMADol (ULTRAM) 50 MG tablet Take 1 tablet (50 mg total) by mouth every 6 (six) hours as needed. 05/25/20  Yes Jacalyn Lefevre, MD  lisinopril (ZESTRIL) 40 MG tablet Take 1 tablet (40 mg total) by mouth daily. 05/25/20   Jacalyn Lefevre, MD    Allergies    Percocet [oxycodone-acetaminophen]  Review of Systems   Review of Systems  Musculoskeletal:       Right thumb pain  All other systems reviewed and are negative.   Physical Exam Updated Vital Signs BP (!) 153/108 (BP Location: Left Arm) Comment: edp aware, pt out of bp medication x 2 weeks.  Pulse (!) 104   Temp 98.2 F (36.8 C) (Oral)   Resp 18   Ht 4\' 9"  (1.448 m)   Wt 78 kg   LMP 02/23/2020 (Approximate) Comment: tubal ligation, no period in 3 months.  SpO2 100%   BMI 37.22 kg/m   Physical Exam Vitals and nursing note reviewed.  Constitutional:      Appearance: Normal appearance.  HENT:      Head: Normocephalic and atraumatic.     Right Ear: External ear normal.     Left Ear: External ear normal.     Nose: Nose normal.     Mouth/Throat:     Mouth: Mucous membranes are moist.     Pharynx: Oropharynx is clear.  Eyes:     Extraocular Movements: Extraocular movements intact.     Conjunctiva/sclera: Conjunctivae normal.     Pupils: Pupils are equal, round, and reactive to light.  Cardiovascular:     Rate and Rhythm: Normal rate and regular rhythm.     Pulses: Normal pulses.     Heart sounds: Normal heart sounds.  Pulmonary:     Effort: Pulmonary effort is normal.     Breath sounds: Normal breath sounds.  Abdominal:     General: Abdomen is flat. Bowel sounds are normal.     Palpations: Abdomen is soft.  Musculoskeletal:     Cervical back: Normal range of motion and neck supple.     Comments: Right thumb pain, decreased rom  Skin:    General: Skin is warm.     Capillary Refill: Capillary refill takes less than 2  seconds.  Neurological:     General: No focal deficit present.     Mental Status: She is alert and oriented to person, place, and time.     ED Results / Procedures / Treatments   Labs (all labs ordered are listed, but only abnormal results are displayed) Labs Reviewed - No data to display  EKG None  Radiology DG Finger Thumb Right  Result Date: 05/25/2020 CLINICAL DATA:  Pain.  Fall. EXAM: RIGHT THUMB 2+V COMPARISON:  None. FINDINGS: There is no evidence of fracture or dislocation. There is no evidence of arthropathy or other focal bone abnormality. Soft tissue swelling. IMPRESSION: No evidence of acute fracture or dislocation. Electronically Signed   By: Feliberto Harts MD   On: 05/25/2020 09:31    Procedures Procedures (including critical care time)  Medications Ordered in ED Medications  traMADol (ULTRAM) tablet 50 mg (50 mg Oral Given 05/25/20 0933)  ibuprofen (ADVIL) tablet 800 mg (800 mg Oral Given 05/25/20 3704)    ED Course  I have  reviewed the triage vital signs and the nursing notes.  Pertinent labs & imaging results that were available during my care of the patient were reviewed by me and considered in my medical decision making (see chart for details).    MDM Rules/Calculators/A&P                          Pt does not have a fx.  I am concerned she may have "skier's thumb".  She is placed in a thumb spica splint and is instructed to f/u with ortho.  Pt also has a hx of htn and is out of her lisinopril.  This med will be refilled.  She is instructed to establish care with a pcp.   Return if worse. Final Clinical Impression(s) / ED Diagnoses Final diagnoses:  Sprain of metacarpophalangeal (MCP) joint of right thumb, initial encounter    Rx / DC Orders ED Discharge Orders         Ordered    lisinopril (ZESTRIL) 40 MG tablet  Daily        05/25/20 0922    traMADol (ULTRAM) 50 MG tablet  Every 6 hours PRN        05/25/20 0945           Jacalyn Lefevre, MD 05/25/20 [phone]

## 2021-05-12 ENCOUNTER — Emergency Department (HOSPITAL_COMMUNITY)
Admission: EM | Admit: 2021-05-12 | Discharge: 2021-05-12 | Disposition: A | Discharge status: Home / Self Care | Payer: Self-pay | Attending: Emergency Medicine | Admitting: Emergency Medicine

## 2021-05-12 ENCOUNTER — Emergency Department (HOSPITAL_COMMUNITY): Payer: Self-pay

## 2021-05-12 ENCOUNTER — Other Ambulatory Visit: Payer: Self-pay

## 2021-05-12 ENCOUNTER — Encounter (HOSPITAL_COMMUNITY): Payer: Self-pay

## 2021-05-12 DIAGNOSIS — R569 Unspecified convulsions: Secondary | ICD-10-CM | POA: Insufficient documentation

## 2021-05-12 DIAGNOSIS — R519 Headache, unspecified: Secondary | ICD-10-CM | POA: Insufficient documentation

## 2021-05-12 LAB — BASIC METABOLIC PANEL
Anion gap: 8 (ref 5–15)
BUN: 25 mg/dL — ABNORMAL HIGH (ref 6–20)
CO2: 26 mmol/L (ref 22–32)
Calcium: 9.1 mg/dL (ref 8.9–10.3)
Chloride: 103 mmol/L (ref 98–111)
Creatinine, Ser: 0.8 mg/dL (ref 0.44–1.00)
GFR, Estimated: >60 mL/min (ref >60)
Glucose, Bld: 115 mg/dL — ABNORMAL HIGH (ref 70–99)
Potassium: 4 mmol/L (ref 3.5–5.1)
Sodium: 137 mmol/L (ref 135–145)

## 2021-05-12 LAB — CBC
HCT: 43.6 % (ref 36.0–46.0)
Hemoglobin: 14.1 g/dL (ref 12.0–15.0)
MCH: 29.9 pg (ref 26.0–34.0)
MCHC: 32.3 g/dL (ref 30.0–36.0)
MCV: 92.4 fL (ref 80.0–100.0)
Platelets: 275 10*3/uL (ref 150–400)
RBC: 4.72 MIL/uL (ref 3.87–5.11)
RDW: 13.3 % (ref 11.5–15.5)
WBC: 11.1 10*3/uL — ABNORMAL HIGH (ref 4.0–10.5)
nRBC: 0 % (ref 0.0–0.2)

## 2021-05-12 MED ORDER — PROCHLORPERAZINE EDISYLATE 10 MG/2ML IJ SOLN
5.0000 mg | Freq: Once | INTRAMUSCULAR | Status: AC
  Administered 2021-05-12: 5 mg via INTRAVENOUS
  Filled 2021-05-12: qty 2

## 2021-05-12 MED ORDER — LEVETIRACETAM 500 MG PO TABS: 500.0000 mg | ORAL_TABLET | Freq: Two times a day (BID) | ORAL | 0 refills | Status: DC

## 2021-05-12 MED ORDER — KETOROLAC TROMETHAMINE 30 MG/ML IJ SOLN
15.0000 mg | Freq: Once | INTRAMUSCULAR | Status: AC
  Administered 2021-05-12: 15 mg via INTRAVENOUS
  Filled 2021-05-12: qty 1

## 2021-05-12 NOTE — ED Provider Notes (Signed)
[facility] EMERGENCY DEPARTMENT Provider Note   CSN: [id number] Arrival date & time: 05/12/21  0617     History  Chief Complaint  Patient presents with   Migraine    Yvette [name] is a 46 y.o. female.   Migraine Associated symptoms include headaches. Patient presents the headache.  States she has had some headaches over the last week but last night developed more severe.  From the back of her head going to the front.  Slight nausea.  No photophobia.  Has a history of migraines but states that normally that is only on the front of her head and not as severe.  No fevers.  No trauma.  States she feels bad. Also states she is been having episodes of loss of consciousness.  That has been going for around the last year.  Had not seen a doctor for it.  States she has episodes she is where she will just stare ahead.  States her hands may curl up a little bit.  States she is not aware of these episodes and can wake up and not even know what happened.  No loss of bladder or bowel control.  Not confused after.  Has not seen anyone for these.  Does not associate the episode with a headache.     Home Medications Prior to Admission medications   Medication Sig Start Date End Date Taking? Authorizing Provider  levETIRAcetam (KEPPRA) 500 MG tablet Take 1 tablet (500 mg total) by mouth 2 (two) times daily. 05/12/21  Yes Benjiman Core, MD  lisinopril (ZESTRIL) 40 MG tablet Take 1 tablet (40 mg total) by mouth daily. 05/25/20   Jacalyn Lefevre, MD  traMADol (ULTRAM) 50 MG tablet Take 1 tablet (50 mg total) by mouth every 6 (six) hours as needed. 05/25/20   Jacalyn Lefevre, MD      Allergies    Acetaminophen and Percocet [oxycodone-acetaminophen]    Review of Systems   Review of Systems  Constitutional:  Negative for appetite change and chills.  Gastrointestinal:  Positive for nausea.  Musculoskeletal:  Negative for back pain.  Neurological:  Positive for syncope and headaches.   Physical  Exam Updated Vital Signs BP (!) 170/93    Pulse 82    Temp 98.1 F (36.7 C) (Oral)    Resp 13    Ht 4\' 9"  (1.448 m)    Wt 73.9 kg    SpO2 99%    BMI 35.27 kg/m  Physical Exam Vitals and nursing note reviewed.  Constitutional:      Appearance: Normal appearance.  Cardiovascular:     Rate and Rhythm: Regular rhythm.  Pulmonary:     Breath sounds: No wheezing or rhonchi.  Abdominal:     Tenderness: There is no abdominal tenderness.  Musculoskeletal:        General: No tenderness.     Cervical back: Neck supple.  Skin:    General: Skin is warm.     Capillary Refill: Capillary refill takes less than 2 seconds.     Findings: No erythema.  Neurological:     Mental Status: She is alert and oriented to person, place, and time.    ED Results / Procedures / Treatments   Labs (all labs ordered are listed, but only abnormal results are displayed) Labs Reviewed  BASIC METABOLIC PANEL - Abnormal; Notable for the following components:      Result Value   Glucose, Bld 115 (*)    BUN 25 (*)    All  other components within normal limits  CBC - Abnormal; Notable for the following components:   WBC 11.1 (*)    All other components within normal limits    EKG EKG Interpretation  Date/Time:  Saturday May 12 2021 07:53:54 EST Ventricular Rate:  86 PR Interval:  131 QRS Duration: 87 QT Interval:  355 QTC Calculation: 425 R Axis:   50 Text Interpretation: Sinus rhythm Baseline wander in lead(s) V3 Confirmed by Benjiman Core [phone]) on 05/12/2021 9:21:06 AM  Radiology CT Head Wo Contrast  Result Date: 05/12/2021 CLINICAL DATA:  Headache. EXAM: CT HEAD WITHOUT CONTRAST TECHNIQUE: Contiguous axial images were obtained from the base of the skull through the vertex without intravenous contrast. COMPARISON:  01/06/2020 FINDINGS: Brain: There is no evidence for acute hemorrhage, hydrocephalus, mass lesion, or abnormal extra-axial fluid collection. No definite CT evidence for acute  infarction. Vascular: No hyperdense vessel or unexpected calcification. Skull: No evidence for fracture. No worrisome lytic or sclerotic lesion. 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 The remaining visualized paranasal sinuses and mastoid air cells are clear. Visualized portions of the globes and intraorbital fat are unremarkable. Other: None. IMPRESSION: Stable.  No acute intracranial abnormality. Electronically Signed   By: Kennith Center M.D.   On: 05/12/2021 07:46    Procedures Procedures    Medications Ordered in ED Medications  ketorolac (TORADOL) 30 MG/ML injection 15 mg (15 mg Intravenous Given 05/12/21 0739)  prochlorperazine (COMPAZINE) injection 5 mg (5 mg Intravenous Given 05/12/21 0737)    ED Course/ Medical Decision Making/ A&P                           Medical Decision Making Patient presents with 2 main complaints.  Headache and mental status changes.  Headache started yesterday.  Severe.  Improved after treatment.  Somewhat similar to previous headaches but usually not over the full head.  Head CT done due to the change headache and also due to the episodes of mental status change. Reported having episodes where she will lose consciousness.  Eyes will still be open.  Patient's husband is here and states that she will have twitching on her face.  Also some repetitive movements in her hands.  Will not speak.  Confused for a little bit after.  Patient states she is having these episodes almost daily but does not certainly happen.  Also happened in her sleep.  There is a video that the patient's husband has that I reviewed.  Potentially could be a seizure.  Discussed on not driving.  Discussed with Dr. Amada Jupiter from neurology.  We will start Keppra 500 mg twice a day.  We will follow-up with neurology.  Lab work reassuring.  Will discharge home with outpatient neurology follow-up.  Problems Addressed: Acute nonintractable headache,  unspecified headache type: acute illness or injury Seizure [facility]): acute illness or injury that poses a threat to life or bodily functions  Amount and/or Complexity of Data Reviewed Independent Historian: spouse Labs: ordered. Decision-making details documented in ED Course. Radiology: ordered and independent interpretation performed. Decision-making details documented in ED Course. ECG/medicine tests: ordered and independent interpretation performed. Decision-making details documented in ED Course.  Risk Prescription drug management. Decision regarding hospitalization.           Final Clinical Impression(s) / ED Diagnoses Final diagnoses:  Acute nonintractable headache, unspecified headache type  Seizure (HCC)    Rx / DC Orders ED Discharge Orders  Ordered    levETIRAcetam (KEPPRA) 500 MG tablet  2 times daily        05/12/21 0924              Benjiman CorePickering, Pixie Burgener, MD 05/12/21 [phone]

## 2021-05-12 NOTE — ED Triage Notes (Signed)
Severe headache that started around 0530. It woke her up from her sleep. Feels it in the back of her head and it goes to her eyes. Usually her migraines just bother the front of her head.

## 2021-05-12 NOTE — ED Notes (Signed)
Patient to CT at this time

## 2021-08-24 DIAGNOSIS — D649 Anemia, unspecified: Secondary | ICD-10-CM | POA: Insufficient documentation

## 2021-08-24 DIAGNOSIS — R569 Unspecified convulsions: Secondary | ICD-10-CM | POA: Insufficient documentation

## 2021-08-24 DIAGNOSIS — I10 Essential (primary) hypertension: Secondary | ICD-10-CM | POA: Insufficient documentation

## 2021-08-24 DIAGNOSIS — J45909 Unspecified asthma, uncomplicated: Secondary | ICD-10-CM | POA: Insufficient documentation

## 2021-08-30 ENCOUNTER — Emergency Department (HOSPITAL_COMMUNITY)
Admission: EM | Admit: 2021-08-30 | Discharge: 2021-08-30 | Disposition: A | Discharge status: Home / Self Care | Payer: Medicaid Other | Attending: Emergency Medicine | Admitting: Emergency Medicine

## 2021-08-30 ENCOUNTER — Other Ambulatory Visit (HOSPITAL_COMMUNITY): Payer: Self-pay

## 2021-08-30 ENCOUNTER — Other Ambulatory Visit: Payer: Self-pay

## 2021-08-30 DIAGNOSIS — R569 Unspecified convulsions: Secondary | ICD-10-CM | POA: Insufficient documentation

## 2021-08-30 DIAGNOSIS — Z91148 Patient's other noncompliance with medication regimen for other reason: Secondary | ICD-10-CM | POA: Insufficient documentation

## 2021-08-30 DIAGNOSIS — I10 Essential (primary) hypertension: Secondary | ICD-10-CM | POA: Insufficient documentation

## 2021-08-30 LAB — COMPREHENSIVE METABOLIC PANEL
ALT: 10 U/L (ref 0–44)
AST: 24 U/L (ref 15–41)
Albumin: 4.3 g/dL (ref 3.5–5.0)
Alkaline Phosphatase: 64 U/L (ref 38–126)
Anion gap: 10 (ref 5–15)
BUN: 17 mg/dL (ref 6–20)
CO2: 24 mmol/L (ref 22–32)
Calcium: 9.8 mg/dL (ref 8.9–10.3)
Chloride: 103 mmol/L (ref 98–111)
Creatinine, Ser: 1 mg/dL (ref 0.44–1.00)
GFR, Estimated: >60 mL/min (ref >60)
Glucose, Bld: 97 mg/dL (ref 70–99)
Potassium: 4.5 mmol/L (ref 3.5–5.1)
Sodium: 137 mmol/L (ref 135–145)
Total Bilirubin: 0.3 mg/dL (ref 0.3–1.2)
Total Protein: 7.5 g/dL (ref 6.5–8.1)

## 2021-08-30 LAB — CBC
HCT: 44.9 % (ref 36.0–46.0)
Hemoglobin: 14.4 g/dL (ref 12.0–15.0)
MCH: 28.9 pg (ref 26.0–34.0)
MCHC: 32.1 g/dL (ref 30.0–36.0)
MCV: 90.2 fL (ref 80.0–100.0)
Platelets: 324 10*3/uL (ref 150–400)
RBC: 4.98 MIL/uL (ref 3.87–5.11)
RDW: 14 % (ref 11.5–15.5)
WBC: 12.9 10*3/uL — ABNORMAL HIGH (ref 4.0–10.5)
nRBC: 0 % (ref 0.0–0.2)

## 2021-08-30 MED ORDER — LISINOPRIL 40 MG PO TABS
40.0000 mg | ORAL_TABLET | Freq: Every day | ORAL | 0 refills | Status: DC
  Filled 2021-08-30: qty 30, 30d supply, fill #0

## 2021-08-30 MED ORDER — LEVETIRACETAM IN NACL 1000 MG/100ML IV SOLN
1000.0000 mg | Freq: Once | INTRAVENOUS | Status: AC
Start: 2021-08-30 — End: 2021-08-30
  Administered 2021-08-30: 1000 mg via INTRAVENOUS
  Filled 2021-08-30: qty 100

## 2021-08-30 MED ORDER — LEVETIRACETAM 500 MG PO TABS
500.0000 mg | ORAL_TABLET | Freq: Two times a day (BID) | ORAL | 0 refills | Status: DC
  Filled 2021-08-30: qty 60, 30d supply, fill #0

## 2021-08-30 NOTE — ED Provider Notes (Signed)
?[facility] EMERGENCY DEPARTMENT ?Provider Note ? ? ?CSN: [id number] ?Arrival date & time: 08/30/21  1103 ? ?  ? ?History ? ?Chief Complaint  ?Patient presents with  ? Medication Refill  ? ? ?Yvette [name] is a 46 y.o. female. ? ? ?Medication Refill ?Patient sent in from health department.  Reportedly having seizures.  I had actually seen her a couple months ago.  Had had episodes of losing consciousness and headache.  Likely she was having seizures.  Was having some shaking.  Started on Keppra at that time.  Had not had any more the episodes up until recently when she ran out of the medicine.  Has been out of the medicine for around 3 weeks and is daily having 1 or 2 of these episodes.  Had been got more severe and recently bit her tongue with it.  Did have a fall and hit her head yesterday but has been back to baseline since then.  Not on blood thinners.  Also was on lisinopril for high blood pressure and has been out of that for weeks now 2.  No chest pain.  No trouble breathing. ?  ? ?Home Medications ?Prior to Admission medications   ?Medication Sig Start Date End Date Taking? Authorizing Provider  ?levETIRAcetam (KEPPRA) 500 MG tablet Take 1 tablet (500 mg total) by mouth 2 (two) times daily. 08/30/21  Yes Benjiman Core, MD  ?lisinopril (ZESTRIL) 40 MG tablet Take 1 tablet (40 mg total) by mouth daily. 08/30/21  Yes Benjiman Core, MD  ?   ? ?Allergies    ?Acetaminophen and Percocet [oxycodone-acetaminophen]   ? ?Review of Systems   ?Review of Systems  ?Neurological:  Positive for seizures.  ? ?Physical Exam ?Updated Vital Signs ?BP (!) 136/99   Pulse 92   Temp 98.4 ?F (36.9 ?C) (Oral)   Resp 16   SpO2 98%  ?Physical Exam ?Vitals and nursing note reviewed.  ?HENT:  ?   Head: Atraumatic.  ?   Mouth/Throat:  ?   Comments: Bite injury to tongue. ?Pulmonary:  ?   Effort: Pulmonary effort is normal.  ?Abdominal:  ?   Tenderness: There is no abdominal tenderness.  ?Skin: ?   General:  Skin is warm.  ?   Capillary Refill: Capillary refill takes less than 2 seconds.  ?   Coloration: Skin is not jaundiced.  ?Neurological:  ?   Mental Status: She is alert and oriented to person, place, and time.  ? ? ?ED Results / Procedures / Treatments   ?Labs ?(all labs ordered are listed, but only abnormal results are displayed) ?Labs Reviewed  ?CBC - Abnormal; Notable for the following components:  ?    Result Value  ? WBC 12.9 (*)   ? All other components within normal limits  ?COMPREHENSIVE METABOLIC PANEL  ? ? ?EKG ?None ? ?Radiology ?No results found. ? ?Procedures ?Procedures  ? ? ?Medications Ordered in ED ?Medications  ?levETIRAcetam (KEPPRA) IVPB 1000 mg/100 mL premix (0 mg Intravenous Stopped 08/30/21 1303)  ?levETIRAcetam (KEPPRA) IVPB 1000 mg/100 mL premix (0 mg Intravenous Stopped 08/30/21 1210)  ? ? ?ED Course/ Medical Decision Making/ A&P ?  ?                        ?Medical Decision Making ?Amount and/or Complexity of Data Reviewed ?Labs: ordered. ? ?Risk ?Prescription drug management. ? ? ?Patient with likely seizures.  History of same.  Had been having episodes of  it that resolved while on Keppra.  Potentially had some mild change in her mood but not too bad.  However ran out of the Keppra and began to have the episodes again.  Episodes been worsening.  Did have 1 fall that was a day or 2 ago and has been returning to her mental status in between.  Do not think we need intracranial imaging at this time.  Will load on Keppra IV.  We will start back on 500 mg twice a day since that was controlling her seizures.  Discussed with transitions of care since patient was having difficulty affording her medicines also.  She has been provided the medicines with a co-pay.  Also PCP follow-up has been arranged.  We will also give resources for neurology follow-up.  Appears stable for discharge home. ? ? ? ? ? ? ? ?Final Clinical Impression(s) / ED Diagnoses ?Final diagnoses:  ?Hypertension, unspecified type   ?Seizure (HCC)  ?Noncompliance with medications  ? ? ?Rx / DC Orders ?ED Discharge Orders   ? ?      Ordered  ?  levETIRAcetam (KEPPRA) 500 MG tablet  2 times daily       ? 08/30/21 1339  ?  lisinopril (ZESTRIL) 40 MG tablet  Daily       ? 08/30/21 1339  ? ?  ?  ? ?  ? ? ?  ?Benjiman Core, MD ?08/30/21 1615 ? ?

## 2021-08-30 NOTE — Discharge Instructions (Signed)
A PCP follow-up visit has been arranged.  They can also potentially help you get in with neurology although you have been given the list of the other neurology groups both in [place] and here. ?

## 2021-08-30 NOTE — Progress Notes (Signed)
Transition of Care [facility]) - Emergency Department Mini Assessment ? ? ?Patient Details  ?Name: Yvette [name] ?MRN: [id number] ?Date of Birth: May 23, 1975 ? ?Transition of Care (TOC) CM/SW Contact:    ?Fuller Mandril, RN ?Phone Number: ?08/30/2021, 1:54 PM ? ? ?Clinical Narrative: ?RNCM consulted regarding uninsured pt requiring discharge Rx. [facility] will deliver Rx to patient at bedside prior to discharge from hospital.  ? ? ?RNCM set up appointment with [facility] on 5/12@0945 .  Spoke with pt at bedside and advised to please arrive 15 min early and take a picture ID and your current medications.  Pt verbalizes understanding of keeping appointment. ? ? ? ? ?ED Mini Assessment: ?What brought you to the Emergency Department? : (P) seizure medication ? ?Barriers to Discharge: (P) ED Uninsured needing PCP establishment, ED Uninsured needing medication assistance ? ?Barrier interventions: (P) TOC pharmacy, follow-up appointment with [facility] ? ?Means of departure: (P) Car ? ?Interventions which prevented an admission or readmission: (P) Medication Review, Follow-up medical appointment ? ? ? ?Patient Contact and Communications ?  ?  ?  ? ,     ?  ?  ? ?Patient states their goals for this hospitalization and ongoing recovery are:: (P) Get my medications for seizures ?  ?  ? ?Admission diagnosis:  Seizure ?There are no problems to display for this patient. ? ?PCP:  Patient, No Pcp Per (Inactive) ?Pharmacy:   ?[place], [place] [id number] ?[address] ?[place] [address] ?Phone: [phone] Fax: [phone] ? ?[facility] ?[address] ?[place] [address] ?Phone: [phone] Fax: [phone] ? ? ?

## 2021-08-30 NOTE — ED Notes (Signed)
Pt verbalizes understanding of discharge instructions. Opportunity for questions and answers were provided. Pt discharged from the ED.   ?

## 2021-08-30 NOTE — ED Triage Notes (Addendum)
Pt reports requesting medication refill to tx seizures and for HTN. Pt reports being out of seizure medication x 3 weeks and is having seizures daily.  ? ? ?HX: Seizures, HTN, asthma  ?

## 2021-09-14 ENCOUNTER — Ambulatory Visit: Payer: Medicaid Other | Admitting: Internal Medicine

## 2021-09-26 ENCOUNTER — Encounter: Payer: Self-pay | Admitting: Diagnostic Neuroimaging

## 2021-09-26 ENCOUNTER — Inpatient Hospital Stay: Payer: Self-pay | Admitting: Neurology

## 2021-10-13 ENCOUNTER — Encounter (HOSPITAL_COMMUNITY): Payer: Self-pay | Admitting: Emergency Medicine

## 2021-10-13 ENCOUNTER — Other Ambulatory Visit: Payer: Self-pay

## 2021-10-13 ENCOUNTER — Emergency Department (HOSPITAL_COMMUNITY)
Admission: EM | Admit: 2021-10-13 | Discharge: 2021-10-13 | Disposition: A | Discharge status: Home / Self Care | Payer: Self-pay | Attending: Emergency Medicine | Admitting: Emergency Medicine

## 2021-10-13 DIAGNOSIS — Z7951 Long term (current) use of inhaled steroids: Secondary | ICD-10-CM | POA: Insufficient documentation

## 2021-10-13 DIAGNOSIS — Z79899 Other long term (current) drug therapy: Secondary | ICD-10-CM | POA: Insufficient documentation

## 2021-10-13 DIAGNOSIS — J45909 Unspecified asthma, uncomplicated: Secondary | ICD-10-CM | POA: Insufficient documentation

## 2021-10-13 DIAGNOSIS — I10 Essential (primary) hypertension: Secondary | ICD-10-CM | POA: Insufficient documentation

## 2021-10-13 DIAGNOSIS — R569 Unspecified convulsions: Secondary | ICD-10-CM | POA: Insufficient documentation

## 2021-10-13 DIAGNOSIS — I2510 Atherosclerotic heart disease of native coronary artery without angina pectoris: Secondary | ICD-10-CM | POA: Insufficient documentation

## 2021-10-13 HISTORY — DX: Unspecified convulsions: R56.9

## 2021-10-13 LAB — COMPREHENSIVE METABOLIC PANEL
ALT: 12 U/L (ref 0–44)
AST: 15 U/L (ref 15–41)
Albumin: 4 g/dL (ref 3.5–5.0)
Alkaline Phosphatase: 54 U/L (ref 38–126)
Anion gap: 6 (ref 5–15)
BUN: 20 mg/dL (ref 6–20)
CO2: 27 mmol/L (ref 22–32)
Calcium: 9.2 mg/dL (ref 8.9–10.3)
Chloride: 105 mmol/L (ref 98–111)
Creatinine, Ser: 1.01 mg/dL — ABNORMAL HIGH (ref 0.44–1.00)
GFR, Estimated: >60 mL/min (ref >60)
Glucose, Bld: 126 mg/dL — ABNORMAL HIGH (ref 70–99)
Potassium: 4 mmol/L (ref 3.5–5.1)
Sodium: 138 mmol/L (ref 135–145)
Total Bilirubin: 0.4 mg/dL (ref 0.3–1.2)
Total Protein: 7.1 g/dL (ref 6.5–8.1)

## 2021-10-13 LAB — CBC WITH DIFFERENTIAL/PLATELET
Abs Immature Granulocytes: 0.05 10*3/uL (ref 0.00–0.07)
Basophils Absolute: 0.1 10*3/uL (ref 0.0–0.1)
Basophils Relative: 0 %
Eosinophils Absolute: 0.1 10*3/uL (ref 0.0–0.5)
Eosinophils Relative: 1 %
HCT: 39.7 % (ref 36.0–46.0)
Hemoglobin: 13 g/dL (ref 12.0–15.0)
Immature Granulocytes: 0 %
Lymphocytes Relative: 23 %
Lymphs Abs: 2.7 10*3/uL (ref 0.7–4.0)
MCH: 30 pg (ref 26.0–34.0)
MCHC: 32.7 g/dL (ref 30.0–36.0)
MCV: 91.5 fL (ref 80.0–100.0)
Monocytes Absolute: 0.8 10*3/uL (ref 0.1–1.0)
Monocytes Relative: 7 %
Neutro Abs: 8.4 10*3/uL — ABNORMAL HIGH (ref 1.7–7.7)
Neutrophils Relative %: 69 %
Platelets: 280 10*3/uL (ref 150–400)
RBC: 4.34 MIL/uL (ref 3.87–5.11)
RDW: 13.6 % (ref 11.5–15.5)
WBC: 12.1 10*3/uL — ABNORMAL HIGH (ref 4.0–10.5)
nRBC: 0 % (ref 0.0–0.2)

## 2021-10-13 MED ORDER — LEVETIRACETAM IN NACL 1500 MG/100ML IV SOLN
1500.0000 mg | Freq: Once | INTRAVENOUS | Status: AC
  Administered 2021-10-13: 1500 mg via INTRAVENOUS
  Filled 2021-10-13: qty 100

## 2021-10-13 MED ORDER — LEVETIRACETAM 500 MG PO TABS: 500.0000 mg | ORAL_TABLET | Freq: Two times a day (BID) | ORAL | 0 refills | Status: DC

## 2021-10-13 NOTE — ED Provider Notes (Signed)
[facility] EMERGENCY DEPARTMENT Provider Note   CSN: [id number] Arrival date & time: 10/13/21  1321     History  Chief Complaint  Patient presents with   Seizures    Yvette [name] is a 46 y.o. female.   Seizures Patient presents with possible seizure.  Reportedly was at home and had shaking.  Was incontinent of urine.  Has had potential seizures in the past.  But seen in the ER a couple times for it but has not followed up with neurology due to cost.  Has been on Keppra but has been out of it for couple days now for reportedly having events otherwise.  I reviewed videos of these events previously when I had seen her with the first episodes.  No chest pain.    Past Medical History:  Diagnosis Date   Anemia    Asthma    Coronary artery disease    Hypertension    Seizures (HCC)     Home Medications Prior to Admission medications   Medication Sig Start Date End Date Taking? Authorizing Provider  albuterol (VENTOLIN HFA) 108 (90 Base) MCG/ACT inhaler Inhale 1-2 puffs into the lungs every 6 (six) hours as needed for wheezing or shortness of breath.   Yes [provider]  levETIRAcetam (KEPPRA) 500 MG tablet Take 1 tablet (500 mg total) by mouth 2 (two) times daily. 08/30/21  Yes Benjiman Core, MD  lisinopril (ZESTRIL) 40 MG tablet Take 1 tablet (40 mg total) by mouth daily. 08/30/21  Yes Benjiman Core, MD      Allergies    Acetaminophen and Percocet [oxycodone-acetaminophen]    Review of Systems   Review of Systems  Neurological:  Positive for seizures.    Physical Exam Updated Vital Signs BP (!) 148/97   Pulse 93   Temp 98 F (36.7 C) (Oral)   Resp 19   Ht 5\' 1"  (1.549 m)   Wt 77.1 kg   SpO2 99%   BMI 32.12 kg/m  Physical Exam Vitals and nursing note reviewed.  Cardiovascular:     Rate and Rhythm: Regular rhythm.  Abdominal:     Tenderness: There is no abdominal tenderness.  Musculoskeletal:     Cervical back: Neck supple.  Skin:     General: Skin is warm.  Neurological:     Mental Status: She is alert and oriented to person, place, and time. Mental status is at baseline.     ED Results / Procedures / Treatments   Labs (all labs ordered are listed, but only abnormal results are displayed) Labs Reviewed  COMPREHENSIVE METABOLIC PANEL - Abnormal; Notable for the following components:      Result Value   Glucose, Bld 126 (*)    Creatinine, Ser 1.01 (*)    All other components within normal limits  CBC WITH DIFFERENTIAL/PLATELET - Abnormal; Notable for the following components:   WBC 12.1 (*)    Neutro Abs 8.4 (*)    All other components within normal limits  CBG MONITORING, ED  POC URINE PREG, ED    EKG None  Radiology No results found.  Procedures Procedures    Medications Ordered in ED Medications  levETIRAcetam (KEPPRA) IVPB 1500 mg/ 100 mL premix (has no administration in time range)    ED Course/ Medical Decision Making/ A&P                           Medical Decision Making Problems Addressed: Seizure [facility]):  chronic illness or injury that poses a threat to life or bodily functions  Amount and/or Complexity of Data Reviewed Labs: ordered.  Risk Prescription drug management.   Patient presents with likely recurrent seizure activity.  History of same.  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  Cannot get on disability and reportedly cannot see health department.  Had recurrent seizure.  This time however appears more tonic-clonic and was incontinent of urine.  More absence with some shaking previously.  No previous urinary incontinence.  Has been off her Keppra for a couple days.  Patient and her husband I have seen before.  Discussed with patient and she has had no episodes when she is on the Keppra but when she stopped she does have any episodes.  Loaded on IV Keppra here.  Transition care will be consulted and hopefully later can help get her  into more care.  Given prescription for Keppra.  Appears stable to discharge home at this time.        Final Clinical Impression(s) / ED Diagnoses Final diagnoses:  None    Rx / DC Orders ED Discharge Orders     None         Benjiman Core, MD 10/13/21 1642

## 2021-10-13 NOTE — ED Notes (Signed)
Patient refused urine pregnancy test. Stated that she knows there is zero chance she is pregnant.

## 2021-10-13 NOTE — Discharge Instructions (Addendum)
Try and get in with a PCP and a neurologist.  You have been given the 3 local groups for neurology.  Hopefully transition the care will also be able to help get you into a provider
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
## 2021-12-11 ENCOUNTER — Emergency Department (HOSPITAL_COMMUNITY)
Admission: EM | Admit: 2021-12-11 | Discharge: 2021-12-11 | Disposition: A | Discharge status: Home / Self Care | Payer: Self-pay | Attending: Emergency Medicine | Admitting: Emergency Medicine

## 2021-12-11 DIAGNOSIS — I2510 Atherosclerotic heart disease of native coronary artery without angina pectoris: Secondary | ICD-10-CM | POA: Insufficient documentation

## 2021-12-11 DIAGNOSIS — J45909 Unspecified asthma, uncomplicated: Secondary | ICD-10-CM | POA: Insufficient documentation

## 2021-12-11 DIAGNOSIS — Z79899 Other long term (current) drug therapy: Secondary | ICD-10-CM | POA: Insufficient documentation

## 2021-12-11 DIAGNOSIS — R569 Unspecified convulsions: Secondary | ICD-10-CM | POA: Insufficient documentation

## 2021-12-11 DIAGNOSIS — I10 Essential (primary) hypertension: Secondary | ICD-10-CM | POA: Insufficient documentation

## 2021-12-11 LAB — CBG MONITORING, ED: Glucose-Capillary: 112 mg/dL — ABNORMAL HIGH (ref 70–99)

## 2021-12-11 MED ORDER — LEVETIRACETAM IN NACL 1500 MG/100ML IV SOLN
1500.0000 mg | Freq: Once | INTRAVENOUS | Status: AC
  Administered 2021-12-11: 1500 mg via INTRAVENOUS
  Filled 2021-12-11: qty 100

## 2021-12-11 MED ORDER — LEVETIRACETAM 500 MG PO TABS: 500.0000 mg | ORAL_TABLET | Freq: Two times a day (BID) | ORAL | 0 refills | Status: DC

## 2021-12-11 NOTE — ED Provider Notes (Signed)
[facility] EMERGENCY DEPARTMENT Provider Note   CSN: [id number] Arrival date & time: 12/11/21  1357     History  Chief Complaint  Patient presents with   Seizures    Denyce [name] is a 46 y.o. female.   Seizures   Patient with medical history of hypertension, asthma, CAD, status post tubal ligation, epilepsy supposed be on Keppra presents today due to seizure-like activity.  Patient's partner has been treated hypertension in the ED, patient does not remember having a seizure but her partner states she was turning off into space for a few seconds which is typically how she presents with seizure.  She did not bite her tongue, no urinary retention.  She denies any weakness and she is not postictal.  She does endorse not taking her Keppra for some number of months secondary to financial restraints.  Not having any headache, chest pain, shortness of breath, no loss of consciousness.  Home Medications Prior to Admission medications   Medication Sig Start Date End Date Taking? Authorizing Provider  albuterol (VENTOLIN HFA) 108 (90 Base) MCG/ACT inhaler Inhale 1-2 puffs into the lungs every 6 (six) hours as needed for wheezing or shortness of breath.    [provider]  levETIRAcetam (KEPPRA) 500 MG tablet Take 1 tablet (500 mg total) by mouth 2 (two) times daily. 10/13/21   Benjiman Core, MD  lisinopril (ZESTRIL) 40 MG tablet Take 1 tablet (40 mg total) by mouth daily. 08/30/21   Benjiman Core, MD      Allergies    Acetaminophen and Percocet [oxycodone-acetaminophen]    Review of Systems   Review of Systems  Neurological:  Positive for seizures.    Physical Exam Updated Vital Signs BP (!) 174/109 (BP Location: Left Arm)   Pulse 90   Temp 98.9 F (37.2 C) (Oral)   Resp 20   SpO2 97%  Physical Exam Vitals and nursing note reviewed. Exam conducted with a chaperone present.  Constitutional:      Appearance: Normal appearance.  HENT:     Head: Normocephalic  and atraumatic.     Mouth/Throat:     Comments: No abrasion to tongue Eyes:     General: No scleral icterus.       Right eye: No discharge.        Left eye: No discharge.     Extraocular Movements: Extraocular movements intact.     Pupils: Pupils are equal, round, and reactive to light.  Cardiovascular:     Rate and Rhythm: Normal rate and regular rhythm.     Pulses: Normal pulses.     Heart sounds: Normal heart sounds. No murmur heard.    No friction rub. No gallop.  Pulmonary:     Effort: Pulmonary effort is normal. No respiratory distress.     Breath sounds: Normal breath sounds.  Abdominal:     General: Abdomen is flat. Bowel sounds are normal. There is no distension.     Palpations: Abdomen is soft.     Tenderness: There is no abdominal tenderness.  Skin:    General: Skin is warm and dry.     Coloration: Skin is not jaundiced.  Neurological:     Mental Status: She is alert. Mental status is at baseline.     Coordination: Coordination normal.     Comments: Oriented x3.  Cranial nerves II through XII gross intact, grips equal bilaterally.  Lower extremities equal bilaterally     ED Results / Procedures / Treatments  Labs (all labs ordered are listed, but only abnormal results are displayed) Labs Reviewed  CBG MONITORING, ED - Abnormal; Notable for the following components:      Result Value   Glucose-Capillary 112 (*)    All other components within normal limits    EKG None  Radiology No results found.  Procedures Procedures    Medications Ordered in ED Medications  levETIRAcetam (KEPPRA) IVPB 1500 mg/ 100 mL premix (has no administration in time range)    ED Course/ Medical Decision Making/ A&P                           Medical Decision Making Risk Prescription drug management.   Patient presents due to concern for possible seizure-like activity.  Differential includes but not limited to seizure-like activity secondary to medical noncompliance,  underlying infection, hypoglycemia.  Given patient has a history of seizures and has not been taking her Keppra for multiple months I think medical noncompliance most likely etiology.  On exam there are no focal deficits, patient is not postictal.  She did not bite her tongue, no urinary incontinence.  She is mentating well, seizure-like activity was witnessed by her nurse and stated she was doingand this could be an absence seizure.  She denies any systemic symptoms or fevers, abdomen is soft nontender.  I did check sugar and patient is mildly hyperglycemic but not hypoglycemic.  She is on cardiac monitoring and is in sinus rhythm.  I ordered a Keppra load for the patient.  I considered getting laboratory work-up but patient just had labs checked about a month ago and they were unremarkable.  She is also status post tubal ligation so no need to check for pregnancy.  I have low suspicion that there is any underlying infectious etiology for the seizure-like activity and suspect is more likely secondary to medical noncompliance at this time.  Patient states she is unable to financially afford Keppra, I did put in a transition of care consult.  Social worker to inform you that the Keppra is available at [facility] for $9.  I will inform the patient about this.  I reevaluated the patient, she is requesting leave at this time.  Plan is in place for obtaining her Keppra, she is discharged stable condition.        Final Clinical Impression(s) / ED Diagnoses Final diagnoses:  None    Rx / DC Orders ED Discharge Orders     None         Theron Arista, PA-C 12/11/21 1536    Gloris Manchester, MD 12/13/21 [phone]

## 2021-12-11 NOTE — Discharge Instructions (Signed)
You were seen today for your seizures.  You were given a Keppra load here in the emergency department.  Please go to [facility] for the Keppra refill I have sent it there, generally cost $9.  Please follow-up with neurologist when you are able to.  Return to emergency department for new or concerning symptoms

## 2021-12-11 NOTE — Progress Notes (Signed)
CM noted TOC consult for medication assistance, spoke with patient who reports she did not think she could afford her Keppra medication which she normally fills at [facility].  CM followed up and found that Keppra 500 mg BID (dosage per patient report) costs 9$ for a one month supply.  CM discussed this with patient who reported she did not realize it was only 9$, but she does not have the money at this time.  CM spoke further with patient who reported she would outreach to her mother who she was confident would lend her the funds to fill this medication.

## 2022-11-06 ENCOUNTER — Ambulatory Visit: Payer: Medicaid Other | Admitting: Family Medicine

## 2022-12-24 DIAGNOSIS — R569 Unspecified convulsions: Secondary | ICD-10-CM | POA: Diagnosis not present

## 2022-12-24 DIAGNOSIS — I10 Essential (primary) hypertension: Secondary | ICD-10-CM | POA: Diagnosis not present

## 2022-12-24 DIAGNOSIS — D649 Anemia, unspecified: Secondary | ICD-10-CM | POA: Diagnosis not present

## 2022-12-24 DIAGNOSIS — Z1329 Encounter for screening for other suspected endocrine disorder: Secondary | ICD-10-CM | POA: Diagnosis not present

## 2023-01-01 DIAGNOSIS — R569 Unspecified convulsions: Secondary | ICD-10-CM | POA: Diagnosis not present

## 2023-01-01 DIAGNOSIS — I10 Essential (primary) hypertension: Secondary | ICD-10-CM | POA: Diagnosis not present

## 2023-01-02 DIAGNOSIS — S40022A Contusion of left upper arm, initial encounter: Secondary | ICD-10-CM | POA: Insufficient documentation

## 2023-02-03 DIAGNOSIS — Z1329 Encounter for screening for other suspected endocrine disorder: Secondary | ICD-10-CM | POA: Insufficient documentation

## 2023-02-03 DIAGNOSIS — Z1331 Encounter for screening for depression: Secondary | ICD-10-CM | POA: Diagnosis not present

## 2023-02-03 DIAGNOSIS — F418 Other specified anxiety disorders: Secondary | ICD-10-CM | POA: Insufficient documentation

## 2023-02-03 DIAGNOSIS — I10 Essential (primary) hypertension: Secondary | ICD-10-CM | POA: Diagnosis not present

## 2023-02-03 DIAGNOSIS — R569 Unspecified convulsions: Secondary | ICD-10-CM | POA: Diagnosis not present

## 2023-02-24 DIAGNOSIS — L723 Sebaceous cyst: Secondary | ICD-10-CM | POA: Diagnosis not present

## 2023-02-24 DIAGNOSIS — R569 Unspecified convulsions: Secondary | ICD-10-CM | POA: Diagnosis not present

## 2023-02-24 DIAGNOSIS — I10 Essential (primary) hypertension: Secondary | ICD-10-CM | POA: Diagnosis not present

## 2023-02-24 DIAGNOSIS — L720 Epidermal cyst: Secondary | ICD-10-CM

## 2023-02-24 DIAGNOSIS — F418 Other specified anxiety disorders: Secondary | ICD-10-CM | POA: Diagnosis not present

## 2023-03-04 ENCOUNTER — Emergency Department (HOSPITAL_COMMUNITY): Payer: Medicaid Other

## 2023-03-04 ENCOUNTER — Encounter (HOSPITAL_COMMUNITY): Payer: Self-pay

## 2023-03-04 ENCOUNTER — Emergency Department (HOSPITAL_COMMUNITY)
Admission: EM | Admit: 2023-03-04 | Discharge: 2023-03-04 | Disposition: A | Discharge status: Home / Self Care | Payer: Medicaid Other | Attending: Emergency Medicine | Admitting: Emergency Medicine

## 2023-03-04 ENCOUNTER — Other Ambulatory Visit: Payer: Self-pay

## 2023-03-04 DIAGNOSIS — Z7982 Long term (current) use of aspirin: Secondary | ICD-10-CM | POA: Insufficient documentation

## 2023-03-04 DIAGNOSIS — R079 Chest pain, unspecified: Secondary | ICD-10-CM | POA: Diagnosis not present

## 2023-03-04 DIAGNOSIS — M549 Dorsalgia, unspecified: Secondary | ICD-10-CM | POA: Diagnosis not present

## 2023-03-04 DIAGNOSIS — S4992XA Unspecified injury of left shoulder and upper arm, initial encounter: Secondary | ICD-10-CM | POA: Diagnosis not present

## 2023-03-04 DIAGNOSIS — S3992XA Unspecified injury of lower back, initial encounter: Secondary | ICD-10-CM | POA: Diagnosis not present

## 2023-03-04 DIAGNOSIS — S0990XA Unspecified injury of head, initial encounter: Secondary | ICD-10-CM

## 2023-03-04 DIAGNOSIS — R000 Tachycardia, unspecified: Secondary | ICD-10-CM | POA: Diagnosis not present

## 2023-03-04 DIAGNOSIS — S59912A Unspecified injury of left forearm, initial encounter: Secondary | ICD-10-CM | POA: Diagnosis not present

## 2023-03-04 DIAGNOSIS — S0033XA Contusion of nose, initial encounter: Secondary | ICD-10-CM | POA: Diagnosis not present

## 2023-03-04 DIAGNOSIS — M47816 Spondylosis without myelopathy or radiculopathy, lumbar region: Secondary | ICD-10-CM | POA: Diagnosis not present

## 2023-03-04 DIAGNOSIS — M48061 Spinal stenosis, lumbar region without neurogenic claudication: Secondary | ICD-10-CM | POA: Diagnosis not present

## 2023-03-04 DIAGNOSIS — R58 Hemorrhage, not elsewhere classified: Secondary | ICD-10-CM | POA: Diagnosis not present

## 2023-03-04 MED ORDER — KETOROLAC TROMETHAMINE 10 MG PO TABS
10.0000 mg | ORAL_TABLET | Freq: Once | ORAL | Status: AC
  Administered 2023-03-04: 10 mg via ORAL
  Filled 2023-03-04: qty 1

## 2023-03-04 MED ORDER — IBUPROFEN 600 MG PO TABS: 600.0000 mg | ORAL_TABLET | Freq: Four times a day (QID) | ORAL | 0 refills | Status: DC | PRN
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
## 2023-03-04 NOTE — ED Notes (Signed)
Pt c/o left side pain to face and ribs. Pt does have little swelling to face, with a bloody nose. Pt states her left arm is hurting.
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
## 2023-04-02 IMAGING — CT CT HEAD W/O CM
3 series · 16 of 47 positions shown, 19 images · non-contrast
Comparison: 01/06/2020

CLINICAL DATA: Headache.

EXAM:
CT HEAD WITHOUT CONTRAST
TECHNIQUE: Contiguous axial images were obtained from the base of the skull
through the vertex without intravenous contrast.

[Series 2: head w o · axial · 0.44mm/px · z∈[+16,+146]mm · 10 of 32 slices shown, 13 images]
[im 3/32  brain]
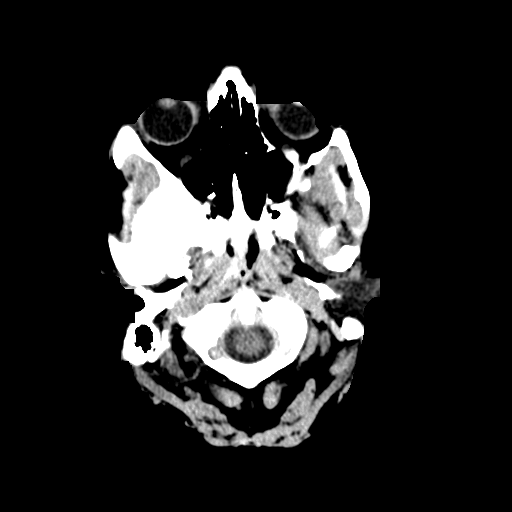
[im 3/32  bone]
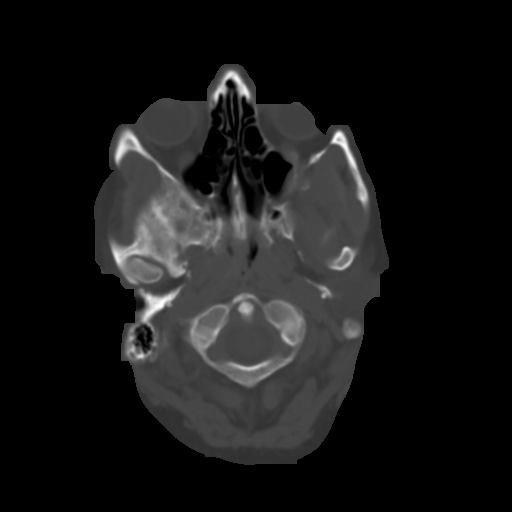
[im 6/32  brain]
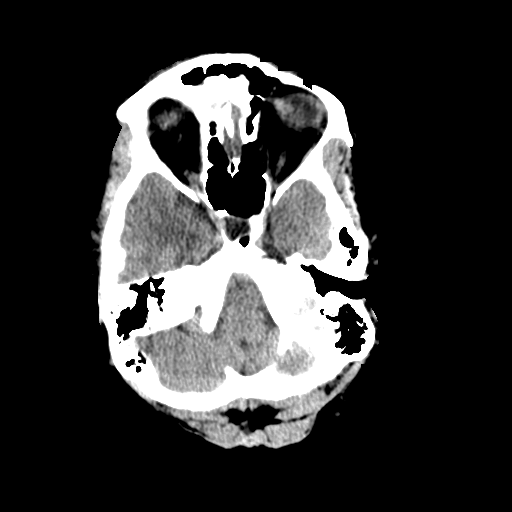
[im 9/32  brain]
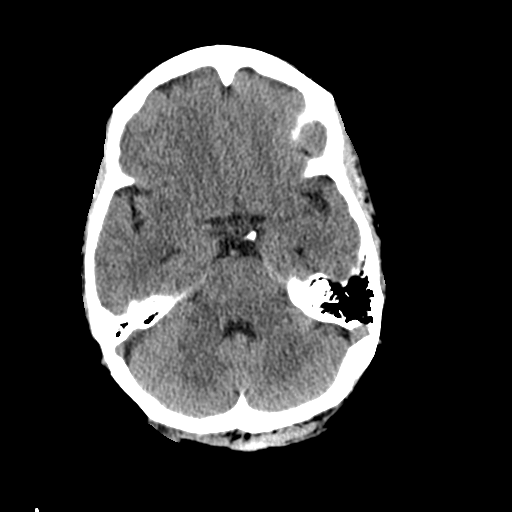
[im 11/32  brain]
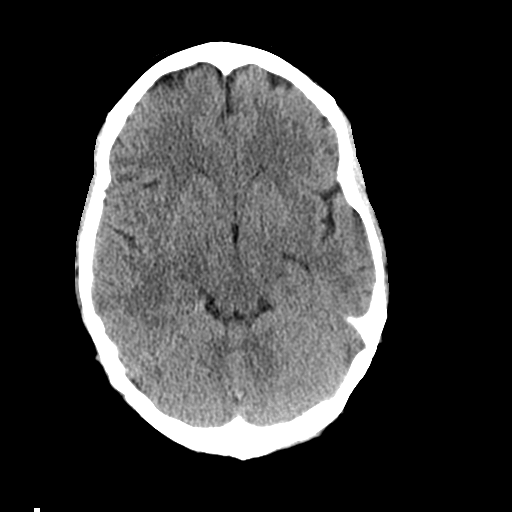
[im 14/32  brain]
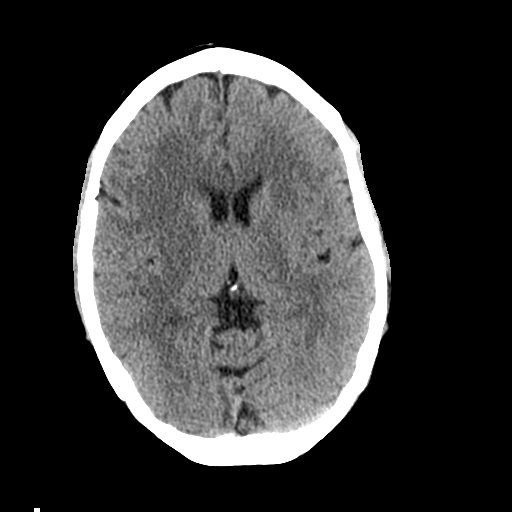
[im 14/32  bone]
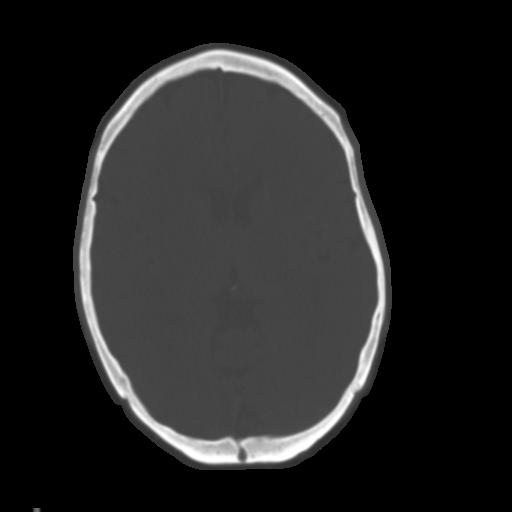
[im 18/32  brain]
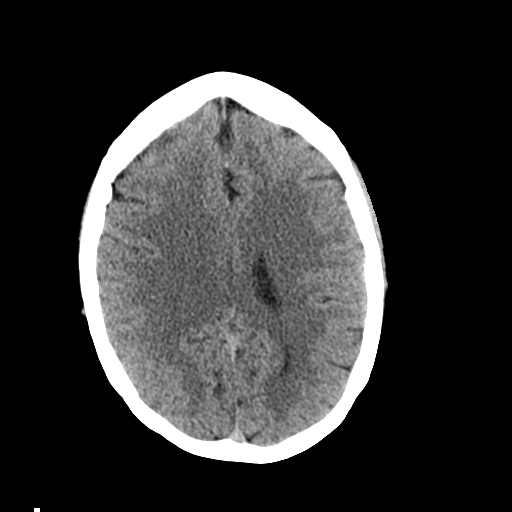
[im 21/32  brain]
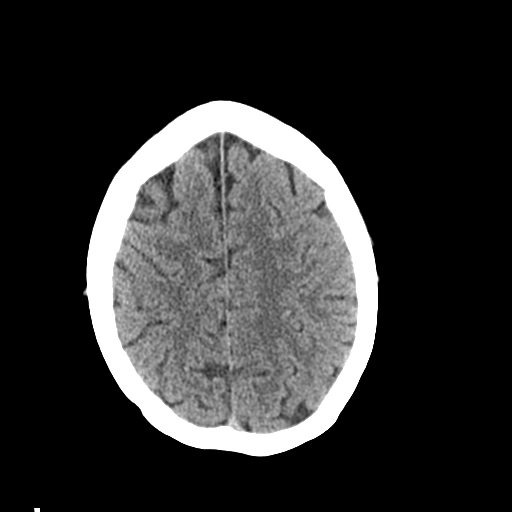
[im 24/32  brain]
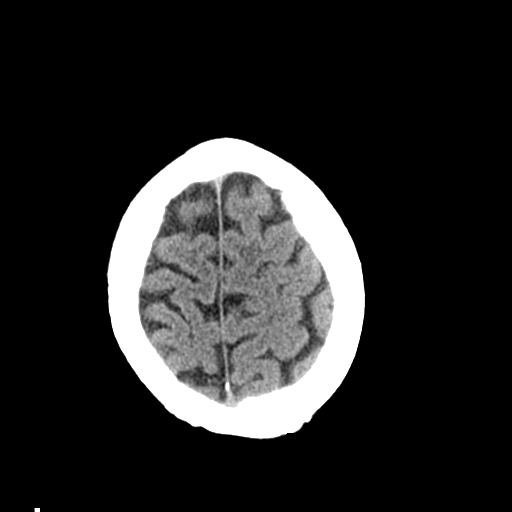
[im 26/32  brain]
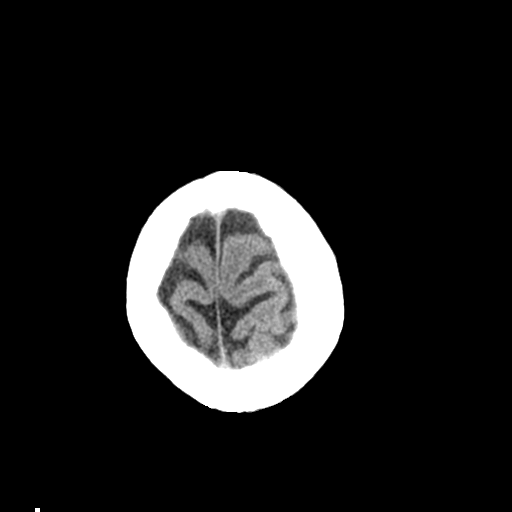
[im 26/32  bone]
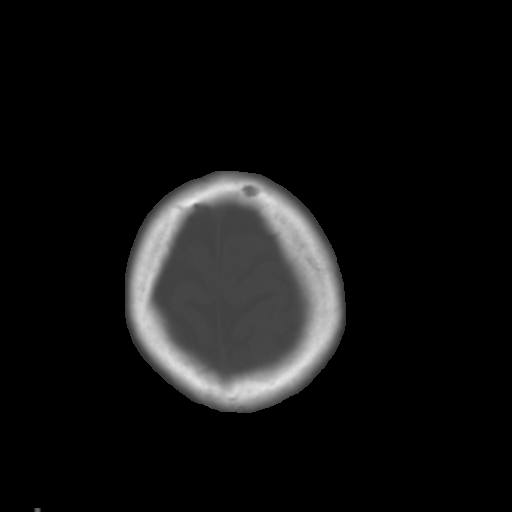
[im 29/32  brain]
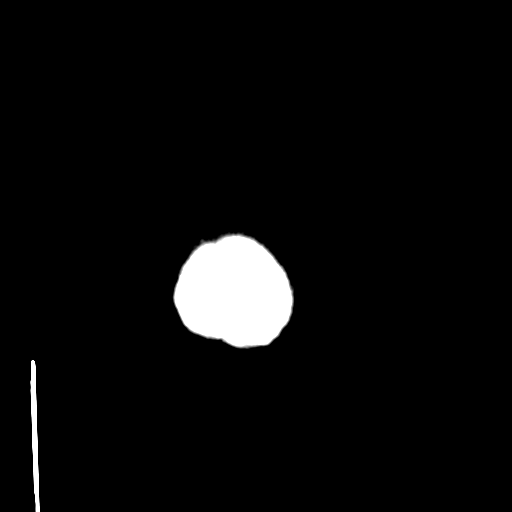

[Series 4: coronal soft · coronal · 0.32mm/px · 3 of 73 slices shown]
[im 25/73  brain]
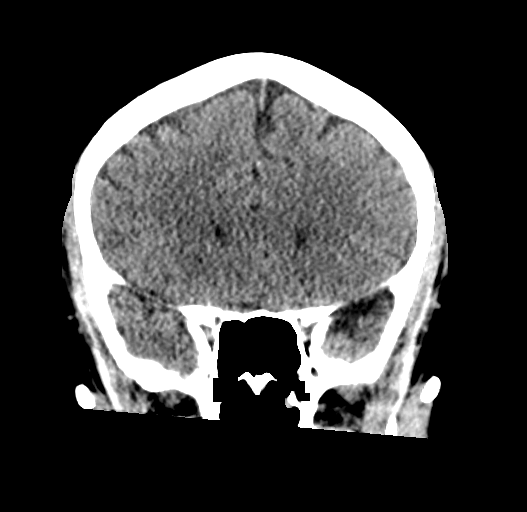
[im 33/73  brain]
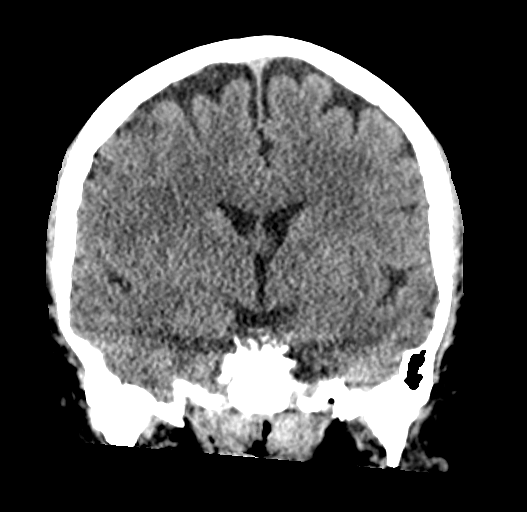
[im 41/73  brain]
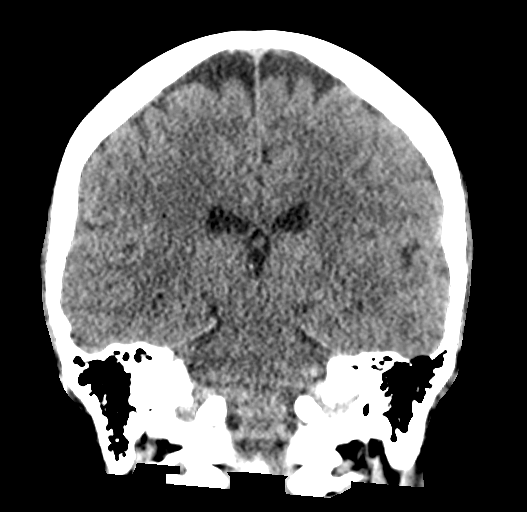

[Series 5: sagittal soft · sagittal · 0.36mm/px · 3 of 67 slices shown]
[im 23/67  brain]
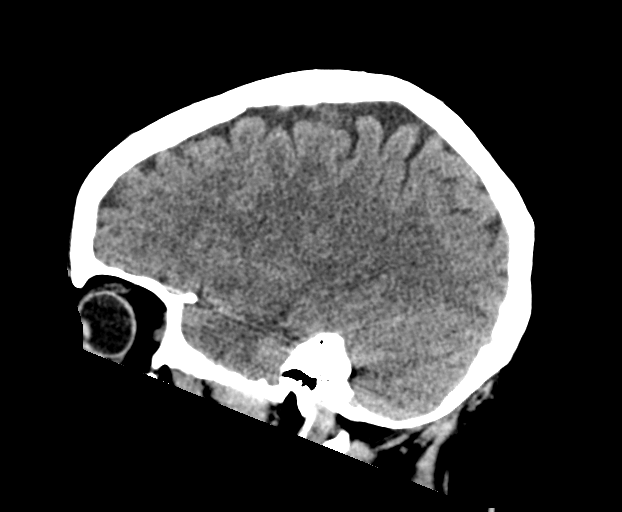
[im 34/67  brain]
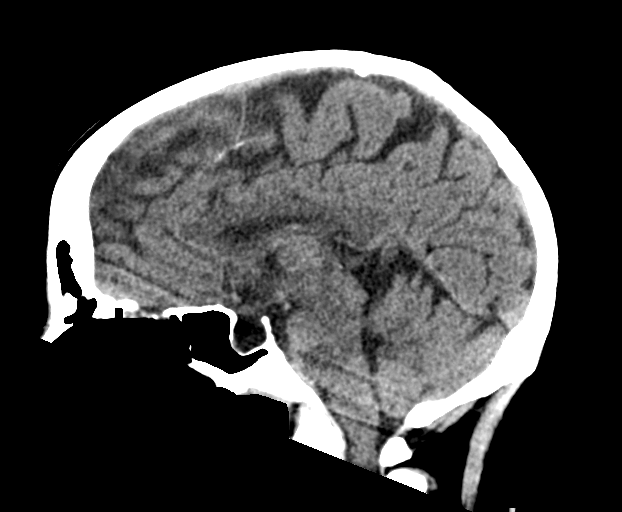
[im 45/67  brain]
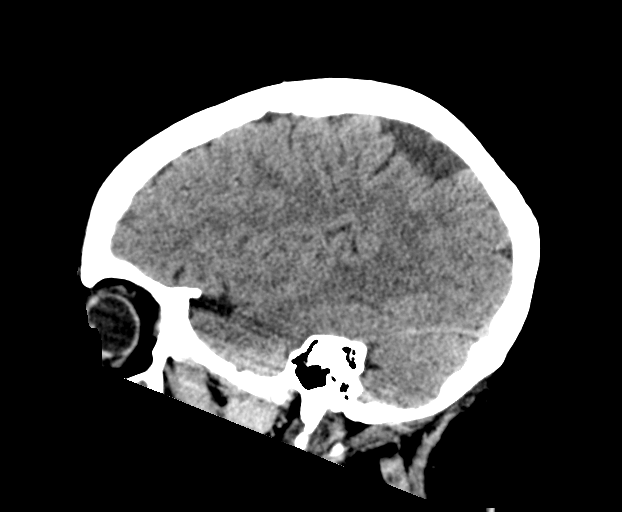

[16 of 47 positions shown; findings below may reference images not displayed]

FINDINGS: Brain: There is no evidence for acute hemorrhage, hydrocephalus,
mass lesion, or abnormal extra-axial fluid collection. No definite
CT evidence for acute infarction.

Vascular: No hyperdense vessel or unexpected calcification.

Skull: No evidence for fracture. No worrisome lytic or sclerotic
lesion.

Sinuses/Orbits: Polypoid chronic mucosal disease again noted left
maxillary sinus. The remaining visualized paranasal sinuses and
mastoid air cells are clear. Visualized portions of the globes and
intraorbital fat are unremarkable.

Other: None.
IMPRESSION: Stable.  No acute intracranial abnormality.

## 2023-07-09 ENCOUNTER — Emergency Department (HOSPITAL_COMMUNITY)
Admission: EM | Admit: 2023-07-09 | Discharge: 2023-07-09 | Disposition: A | Discharge status: Home / Self Care | Attending: Emergency Medicine | Admitting: Emergency Medicine

## 2023-07-09 ENCOUNTER — Other Ambulatory Visit: Payer: Self-pay

## 2023-07-09 ENCOUNTER — Encounter (HOSPITAL_COMMUNITY): Payer: Self-pay

## 2023-07-09 DIAGNOSIS — R569 Unspecified convulsions: Secondary | ICD-10-CM | POA: Insufficient documentation

## 2023-07-09 DIAGNOSIS — Z7982 Long term (current) use of aspirin: Secondary | ICD-10-CM | POA: Insufficient documentation

## 2023-07-09 LAB — COMPREHENSIVE METABOLIC PANEL
ALT: 11 U/L (ref 0–44)
AST: 17 U/L (ref 15–41)
Albumin: 3.9 g/dL (ref 3.5–5.0)
Alkaline Phosphatase: 48 U/L (ref 38–126)
Anion gap: 8 (ref 5–15)
BUN: 19 mg/dL (ref 6–20)
CO2: 25 mmol/L (ref 22–32)
Calcium: 9.2 mg/dL (ref 8.9–10.3)
Chloride: 105 mmol/L (ref 98–111)
Creatinine, Ser: 0.91 mg/dL (ref 0.44–1.00)
GFR, Estimated: >60 mL/min (ref >60)
Glucose, Bld: 96 mg/dL (ref 70–99)
Potassium: 4.2 mmol/L (ref 3.5–5.1)
Sodium: 138 mmol/L (ref 135–145)
Total Bilirubin: 0.4 mg/dL (ref 0.0–1.2)
Total Protein: 7.3 g/dL (ref 6.5–8.1)

## 2023-07-09 LAB — CBC
HCT: 44 % (ref 36.0–46.0)
Hemoglobin: 14 g/dL (ref 12.0–15.0)
MCH: 30.4 pg (ref 26.0–34.0)
MCHC: 31.8 g/dL (ref 30.0–36.0)
MCV: 95.4 fL (ref 80.0–100.0)
Platelets: 249 10*3/uL (ref 150–400)
RBC: 4.61 MIL/uL (ref 3.87–5.11)
RDW: 13.4 % (ref 11.5–15.5)
WBC: 10.5 10*3/uL (ref 4.0–10.5)
nRBC: 0 % (ref 0.0–0.2)

## 2023-07-09 LAB — URINALYSIS, ROUTINE W REFLEX MICROSCOPIC
Bilirubin Urine: NEGATIVE
Glucose, UA: NEGATIVE mg/dL
Hgb urine dipstick: NEGATIVE
Ketones, ur: NEGATIVE mg/dL
Leukocytes,Ua: NEGATIVE
Nitrite: NEGATIVE
Protein, ur: NEGATIVE mg/dL
Specific Gravity, Urine: 1.016 (ref 1.005–1.030)
pH: 7 (ref 5.0–8.0)

## 2023-07-09 LAB — CBG MONITORING, ED: Glucose-Capillary: 91 mg/dL (ref 70–99)

## 2023-07-09 MED ORDER — LEVETIRACETAM IN NACL 1000 MG/100ML IV SOLN
1000.0000 mg | Freq: Once | INTRAVENOUS | Status: AC
  Administered 2023-07-09: 1000 mg via INTRAVENOUS
  Filled 2023-07-09: qty 100

## 2023-07-09 NOTE — Discharge Instructions (Signed)
 Please follow-up closely with neurology on an outpatient basis.  Please continue to take your medications as directed.  Return to emergency department immediately for any new or worsening symptoms.

## 2023-07-09 NOTE — ED Notes (Signed)
 See triage notes. Pt alert to person and place, did not know time/situation. Pupils perrla and NIH negative. Moving all extremities. Sig other has video of how pt was acting. Woke at 0630 and fine. Got in car around 0815 and pt started staring out, trying to take shoes off, not verbally responding and appeared confused. No shaking or tremors noted in video. Sig other states pt is on meds for seizures and this is how they usually present but never lasts this long. States usually lasts approx 5-10 min but this time was over .

## 2023-07-09 NOTE — ED Notes (Signed)
 Pt now completely a/o x 5.

## 2023-07-09 NOTE — ED Provider Notes (Signed)
 [place] EMERGENCY DEPARTMENT AT [facility] Provider Note   CSN: [id number] Arrival date & time: 07/09/23  1324     History  Chief Complaint  Patient presents with   Altered Mental Status    Yvette [name] is a 48 y.o. female.  Patient is a 48 year old female who presents to the emergency department with her significant other who notes that the patient had a seizure this morning.  He notes that this is not out of the ordinary for the patient and states that the seizure did last longer than normal.  Patient was postictal on initial arrival to the emergency department though has returned back to her baseline at this time.  Patient notes that she was feeling at her baseline before onset this morning.  Patient is currently on Depakote and Keppra.  Patient notes that she has been compliant with these medications.  She does not currently follow with a neurologist on an outpatient basis.  Patient notes that she has had no associated dizziness, lightheadedness or syncope.  She denies any chest pain, palpitations, shortness of breath.  There is been no abdominal pain, nausea, vomiting, diarrhea.   Altered Mental Status Associated symptoms: seizures        Home Medications Prior to Admission medications   Medication Sig Start Date End Date Taking? Authorizing Provider  albuterol (VENTOLIN HFA) 108 (90 Base) MCG/ACT inhaler Inhale 1-2 puffs into the lungs every 6 (six) hours as needed for wheezing or shortness of breath.    [provider]  aspirin EC 325 MG tablet Take 325 mg by mouth daily.    [provider]  carbamazepine (CARBATROL) 200 MG 12 hr capsule Take 200 mg by mouth 2 (two) times daily. 02/09/23   [provider]  Cholecalciferol (VITAMIN D-3 PO) Take 1 tablet by mouth daily.    [provider]  divalproex (DEPAKOTE) 250 MG DR tablet Take 250 mg by mouth 3 (three) times daily. Patient not taking: Reported on 03/04/2023    [provider]  DULoxetine (CYMBALTA) 30 MG capsule Take 30 mg by mouth daily. 02/24/23   [provider]  hydrocortisone 2.5 % cream Apply 1 Application topically 2 (two) times daily. Patient not taking: Reported on 03/04/2023 02/24/23   [provider]  hydrOXYzine (VISTARIL) 25 MG capsule Take 25 mg by mouth every 6 (six) hours as needed for anxiety. 02/03/23   [provider]  ibuprofen (ADVIL) 600 MG tablet Take 1 tablet (600 mg total) by mouth every 6 (six) hours as needed. 03/04/23   Carmel Sacramento A, PA-C  levETIRAcetam (KEPPRA) 1000 MG tablet Take 1,000 mg by mouth 2 (two) times daily. 02/09/23   [provider]  losartan (COZAAR) 50 MG tablet Take 50 mg by mouth daily. 12/25/22   [provider]  SYMBICORT 80-4.5 MCG/ACT inhaler Inhale 2 puffs into the lungs in the morning and at bedtime. 12/24/22   [provider]  valproic acid (DEPAKENE) 250 MG capsule Take 250 mg by mouth 2 (two) times daily. 02/26/23   [provider]      Allergies    Acetaminophen, Lisinopril, and Percocet [oxycodone-acetaminophen]    Review of Systems   Review of Systems  Neurological:  Positive for seizures.  All other systems reviewed and are negative.   Physical Exam Updated Vital Signs BP (!) 158/110 (BP Location: Right Arm)   Pulse 86   Temp 98.9 F (37.2 C) (Oral)   Resp 16  Ht 4\' 9"  (1.448 m)   Wt 69.5 kg   SpO2 99%   BMI 33.16 kg/m  Physical Exam Vitals and nursing note reviewed.  Constitutional:      Appearance: Normal appearance.  HENT:     Head: Normocephalic and atraumatic.     Nose: Nose normal.     Mouth/Throat:     Mouth: Mucous membranes are moist.  Eyes:     Extraocular Movements: Extraocular movements intact.     Conjunctiva/sclera: Conjunctivae normal.     Pupils: Pupils are equal, round, and reactive to light.  Cardiovascular:     Rate and Rhythm: Normal rate and regular rhythm.     Pulses: Normal pulses.      Heart sounds: Normal heart sounds.  Pulmonary:     Effort: Pulmonary effort is normal. No respiratory distress.     Breath sounds: Normal breath sounds. No stridor. No wheezing, rhonchi or rales.  Abdominal:     General: Abdomen is flat. Bowel sounds are normal. There is no distension.     Palpations: Abdomen is soft.     Tenderness: There is no abdominal tenderness. There is no guarding.  Musculoskeletal:        General: No swelling, tenderness, deformity or signs of injury. Normal range of motion.     Cervical back: Normal range of motion and neck supple. No rigidity or tenderness.  Skin:    General: Skin is warm and dry.     Findings: No rash.  Neurological:     General: No focal deficit present.     Mental Status: She is alert and oriented to person, place, and time. Mental status is at baseline.     Cranial Nerves: No cranial nerve deficit.     Sensory: No sensory deficit.     Motor: No weakness.     Coordination: Coordination normal.     Gait: Gait normal.     Deep Tendon Reflexes: Reflexes normal.  Psychiatric:        Mood and Affect: Mood normal.        Behavior: Behavior normal.        Thought Content: Thought content normal.        Judgment: Judgment normal.     ED Results / Procedures / Treatments   Labs (all labs ordered are listed, but only abnormal results are displayed) Labs Reviewed  URINALYSIS, ROUTINE W REFLEX MICROSCOPIC - Abnormal; Notable for the following components:      Result Value   APPearance HAZY (*)    All other components within normal limits  CBC  COMPREHENSIVE METABOLIC PANEL  CBG MONITORING, ED  CBG MONITORING, ED    EKG None  Radiology No results found.  Procedures Procedures    Medications Ordered in ED Medications  levETIRAcetam (KEPPRA) IVPB 1000 mg/100 mL premix (has no administration in time range)    ED Course/ Medical Decision Making/ A&P                                 Medical Decision Making Amount and/or  Complexity of Data Reviewed Labs: ordered.  Risk Prescription drug management.   This patient presents to the ED for concern of seizure differential diagnosis includes status epilepticus, breakthrough seizure, intracranial lesion or mass, meningitis, electrolyte derangement    Additional history obtained:  Additional history obtained from significant other External records from outside source obtained and reviewed including medical records  Lab Tests:  I Ordered, and personally interpreted labs.  The pertinent results include: No leukocytosis, no anemia, normal electrolytes, normal kidney function and liver function   Medicines ordered and prescription drug management:  I ordered medication including Keppra for seizures Reevaluation of the patient after these medicines showed that the patient improved I have reviewed the patients home medicines and have made adjustments as needed   Problem List / ED Course:  Patient is doing well at this time and is stable for discharge home.  Discussed with patient that all workup in the emergency department has been unremarkable.  Patient has been observed in the emergency department for almost 3 hours at this point and has returned completely back to her baseline and has been ambulatory without difficulty.  She has no acute complaints at this point.  Patient has been given a loading dose of Keppra as well as information for follow-up with neurology on an outpatient basis as patient states that she now has insurance.  Patient was provided with strict return precautions for any new or worsening symptoms.  Patient significant other is in agreement to plan for discharge home at this point and notes that he will be with her at all times.  Patient and fianc voiced understanding to the plan and had no additional questions.   Social Determinants of Health:  None           Final Clinical Impression(s) / ED Diagnoses Final diagnoses:  None     Rx / DC Orders ED Discharge Orders     None         Lelon Perla, PA-C 07/09/23 1139    Pricilla Loveless, MD 07/10/23 1029

## 2023-07-09 NOTE — ED Notes (Signed)
 Seizure pads placed

## 2023-07-09 NOTE — ED Triage Notes (Signed)
 Pt arrived via POV with her significant other who reports apprx 20-30 mins PTA Pt became catatonic. Pt presents confused. Pt disoriented to place, time and situation. Pt is able to follow commands. Pt does have Hx of seizures, but significant other reports her seizures are not tonic clonic in nature, but Pt does develop a blank stare and non-verbal.

## 2023-12-15 ENCOUNTER — Encounter: Payer: Self-pay | Admitting: Neurology

## 2023-12-15 ENCOUNTER — Ambulatory Visit: Admitting: Neurology

## 2023-12-15 VITALS — BP 155/107 | HR 79 | Resp 15 | Ht 60.0 in

## 2023-12-15 DIAGNOSIS — Z5181 Encounter for therapeutic drug level monitoring: Secondary | ICD-10-CM

## 2023-12-15 DIAGNOSIS — G40909 Epilepsy, unspecified, not intractable, without status epilepticus: Secondary | ICD-10-CM

## 2023-12-15 MED ORDER — LEVETIRACETAM 750 MG PO TABS: 1500.0000 mg | ORAL_TABLET | Freq: Two times a day (BID) | ORAL | 3 refills | Status: AC

## 2023-12-15 MED ORDER — DIVALPROEX SODIUM 500 MG PO DR TAB: 500.0000 mg | DELAYED_RELEASE_TABLET | Freq: Two times a day (BID) | ORAL | 3 refills | Status: AC

## 2023-12-15 NOTE — Patient Instructions (Addendum)
 Increase Keppra  to 1500 mg twice daily  Increase Depakote  to 500 mg twice daily  Continue your other medications  Routine EEG, if normal we will obtain a 3 days ambulatory EEG  MRI Brain with and without contrast  Call for any seizures Follow up in 6 to 8 months or sooner if worse.

## 2023-12-15 NOTE — Progress Notes (Signed)
 [facility]  PATIENT: Yvette [name] DOB: October 28, 1975  REQUESTING CLINICIAN: [place], O'Laf, PA-C HISTORY FROM: Patient/Partner  REASON FOR VISIT: Seizure    HISTORICAL  CHIEF COMPLAINT:  Chief Complaint  Patient presents with   New Patient (Initial Visit)    Rm13, husband present, New patient referral from [facility] [phone] for seizures that uncontrolled while on medications: last sz was Friday 12/12/23. Pt denied missing meds     HISTORY OF PRESENT ILLNESS:  This is a 48 year old woman past medical history of seizure disorder, hypertension, heart disease, history of drug abuse is presenting for evaluation of management of her seizure.  She tells me that her seizures started about 3 years ago, she does not recall her first seizure or why she started having seizures.  She is accompanied by her boyfriend who reports that her seizure frequency has increased, on average 1 seizure per week, but there are days where she has multiple seizures.  Her seizures are described as stiffness, sometime falling to the ground.  This is followed by extended period of postictal confusion where patient is speaking nonsense, she is very confused and cannot remember anything.  She does not remember these events.   With her seizures she did have tongue biting, urinary incontinence bowel incontinence, fall and bruises.  Her last seizure was last week.  She is currently on Keppra  1000 mg twice daily and Depakote  250 mg 3 times daily but in the past she has tried carbamazepine .  She denies any side effect from the medication.  She does have a history of head trauma, substance abuse but cannot think of any other seizure risk factors.  Handedness: Right handed   Onset: 2 to 3 years ago   Seizure Type: Stiffness and unresponsiveness   Current frequency: At least once a week   Any injuries from seizures: Bruises  Seizure risk factors: History  of drug overdose, fell and hit her head 6 years ago  Previous ASMs: Carbamazepine , Depakote , Keppra    Currenty ASMs: Depakote  250 mg TID, Keppra  1000 twice daily   ASMs side effects: Lost some appetite   Brain Images: No acute abnormalities   Previous EEGs: Not available for review    OTHER MEDICAL CONDITIONS: Seizure disorder, Depression, Hypertension    REVIEW OF SYSTEMS: Full 14 system review of systems performed and negative with exception of: As noted in the HPI   ALLERGIES: Allergies  Allergen Reactions   Acetaminophen Shortness Of Breath   Lisinopril  Cough   Percocet [Oxycodone-Acetaminophen] Shortness Of Breath    Tachycardia, sob     HOME MEDICATIONS: Outpatient Medications Prior to Visit  Medication Sig Dispense Refill   albuterol (VENTOLIN HFA) 108 (90 Base) MCG/ACT inhaler Inhale 1-2 puffs into the lungs every 6 (six) hours as needed for wheezing or shortness of breath.     hydrocortisone 2.5 % cream Apply 1 Application topically 2 (two) times daily.     hydrOXYzine (VISTARIL) 25 MG capsule Take 25 mg by mouth every 6 (six) hours as needed for anxiety.     ibuprofen  (ADVIL ) 600 MG tablet Take 1 tablet (600 mg total) by mouth every 6 (six) hours as needed. 30 tablet 0   losartan (COZAAR) 50 MG tablet Take 50 mg by mouth daily.     SYMBICORT 80-4.5 MCG/ACT inhaler Inhale 2 puffs into the lungs in the morning and at bedtime.     divalproex  (DEPAKOTE ) 250 MG DR tablet Take 250 mg by mouth 3 (  three) times daily.     levETIRAcetam  (KEPPRA ) 1000 MG tablet Take 1,000 mg by mouth 2 (two) times daily.     valproic acid  (DEPAKENE ) 250 MG capsule Take 250 mg by mouth 2 (two) times daily.     aspirin EC 325 MG tablet Take 325 mg by mouth daily.     carbamazepine  (CARBATROL ) 200 MG 12 hr capsule Take 200 mg by mouth 2 (two) times daily.     Cholecalciferol (VITAMIN D -3 PO) Take 1 tablet by mouth daily.     DULoxetine (CYMBALTA) 30 MG capsule Take 30 mg by mouth daily.     No  facility-administered medications prior to visit.    PAST MEDICAL HISTORY: Past Medical History:  Diagnosis Date   Anemia    Asthma    Coronary artery disease    Hypertension    Seizures (HCC)     PAST SURGICAL HISTORY: Past Surgical History:  Procedure Laterality Date   CAROTID STENT     placed in 2019   TUBAL LIGATION      FAMILY HISTORY: Family History  Family history unknown: Yes    SOCIAL HISTORY: Social History   Socioeconomic History   Marital status: Married    Spouse name: brandon   Number of children: 3   Years of education: Not on file   Highest education level: GED or equivalent  Occupational History   Not on file  Tobacco Use   Smoking status: Never   Smokeless tobacco: Never  Vaping Use   Vaping status: Never Used  Substance and Sexual Activity   Alcohol use: Not Currently   Drug use: Yes    Types: Marijuana   Sexual activity: Not Currently  Other Topics Concern   Not on file  Social History Narrative   Not on file   Social Drivers of Health   Financial Resource Strain: Not on file  Food Insecurity: Not on file  Transportation Needs: Not on file  Physical Activity: Not on file  Stress: Not on file  Social Connections: Not on file  Intimate Partner Violence: Not on file    PHYSICAL EXAM  GENERAL EXAM/CONSTITUTIONAL: Vitals:  Vitals:   12/15/23 0823 12/15/23 0827 12/15/23 0833  BP: (!) 154/98 (!) 78/47 (!) 155/107  Pulse:  79   Resp:  15   SpO2:  99%   Height:  5' (1.524 m)    Body mass index is 29.92 kg/m. Wt Readings from Last 3 Encounters:  07/09/23 153 lb 3.5 oz (69.5 kg)  03/04/23 153 lb (69.4 kg)  10/13/21 170 lb (77.1 kg)   Patient is in no distress; well developed, nourished and groomed; neck is supple  MUSCULOSKELETAL: Gait, strength, tone, movements noted in Neurologic exam below  NEUROLOGIC: MENTAL STATUS:      No data to display         awake, alert, oriented to person, place and time recent and  remote memory intact normal attention and concentration language fluent, comprehension intact, naming intact fund of knowledge appropriate  CRANIAL NERVE:  2nd, 3rd, 4th, 6th - Visual fields full to confrontation, extraocular muscles intact, no nystagmus 5th - facial sensation symmetric 7th - facial strength symmetric 8th - hearing intact 9th - palate elevates symmetrically, uvula midline 11th - shoulder shrug symmetric 12th - tongue protrusion midline  MOTOR:  normal bulk and tone, full strength in the BUE, BLE  SENSORY:  normal and symmetric to light touch  COORDINATION:  finger-nose-finger, fine finger movements normal  GAIT/STATION:  normal  DIAGNOSTIC DATA (LABS, IMAGING, TESTING) - I reviewed patient records, labs, notes, testing and imaging myself where available.  Lab Results  Component Value Date   WBC 10.5 07/09/2023   HGB 14.0 07/09/2023   HCT 44.0 07/09/2023   MCV 95.4 07/09/2023   PLT 249 07/09/2023      Component Value Date/Time   NA 138 07/09/2023 0941   K 4.2 07/09/2023 0941   CL 105 07/09/2023 0941   CO2 25 07/09/2023 0941   GLUCOSE 96 07/09/2023 0941   BUN 19 07/09/2023 0941   CREATININE 0.91 07/09/2023 0941   CALCIUM 9.2 07/09/2023 0941   PROT 7.3 07/09/2023 0941   ALBUMIN 3.9 07/09/2023 0941   AST 17 07/09/2023 0941   ALT 11 07/09/2023 0941   ALKPHOS 48 07/09/2023 0941   BILITOT 0.4 07/09/2023 0941   GFRNONAA >60 07/09/2023 0941   GFRAA >60 01/06/2020 2114   No results found for: CHOL, HDL, LDLCALC, LDLDIRECT, TRIG No results found for: HGBA1C No results found for: VITAMINB12 No results found for: TSH  EEG 03/04/2023 1. No evidence of acute intracranial abnormality. 2. Partially empty and expanded sella, which is often a normal anatomic variant but can be associated with idiopathic intracranial hypertension  I personally reviewed brain Images.   ASSESSMENT AND PLAN  48 y.o. year old female  with history of  seizure disorder, hypertension, heart disease who is presenting for management of her seizure.  She is currently on Keppra  1000 mg twice daily and Depakote  250 mg 3 times daily but continued to have seizures, last seizure was last week.  Plan will be to increase the Keppra  to 1500 mg twice daily, increase Depakote  to 500 mg twice daily and we will obtain a routine EEG.  If the routine EEG is normal we will obtain a 3-day ambulatory EEG.  I will also obtain MRI brain with and without contrast epilepsy protocol.  Advised them to contact me if she continues to have seizures, at that time we will continue to maximize her medication and an add a third agent, cenobamate versus clobazam versus lamotrigine.  I will see her in 6 to 8 months for follow-up or sooner if worse.   1. Nonintractable epilepsy without status epilepticus, unspecified epilepsy type (HCC)   2. Therapeutic drug monitoring     Patient Instructions  Increase Keppra  to 1500 mg twice daily  Increase Depakote  to 500 mg twice daily  Continue your other medications  Routine EEG, if normal we will obtain a 3 days ambulatory EEG  MRI Brain with and without contrast  Call for any seizures Follow up in 6 to 8 months or sooner if worse.    Per   [place] statutes, patients with seizures are not allowed to drive until they have been seizure-free for six months.  Other recommendations include using caution when using heavy equipment or power tools. Avoid working on ladders or at heights. Take showers instead of baths.  Do not swim alone.  Ensure the water temperature is not too high on the home water heater. Do not go swimming alone. Do not lock yourself in a room alone (i.e. bathroom). When caring for infants or small children, sit down when holding, feeding, or changing them to minimize risk of injury to the child in the event you have a seizure. Maintain good sleep hygiene. Avoid alcohol.  Also recommend adequate sleep, hydration, good  diet and minimize stress.   During the Seizure  - First,  ensure adequate ventilation and place patients on the floor on their left side  Loosen clothing around the neck and ensure the airway is patent. If the patient is clenching the teeth, do not force the mouth open with any object as this can cause severe damage - Remove all items from the surrounding that can be hazardous. The patient may be oblivious to what's happening and may not even know what he or she is doing. If the patient is confused and wandering, either gently guide him/her away and block access to outside areas - Reassure the individual and be comforting - Call 911. In most cases, the seizure ends before EMS arrives. However, there are cases when seizures may last over 3 to 5 minutes. Or the individual may have developed breathing difficulties or severe injuries. If a pregnant patient or a person with diabetes develops a seizure, it is prudent to call an ambulance. - Finally, if the patient does not regain full consciousness, then call EMS. Most patients will remain confused for about 45 to 90 minutes after a seizure, so you must use judgment in calling for help. - Avoid restraints but make sure the patient is in a bed with padded side rails - Place the individual in a lateral position with the neck slightly flexed; this will help the saliva drain from the mouth and prevent the tongue from falling backward - Remove all nearby furniture and other hazards from the area - Provide verbal assurance as the individual is regaining consciousness - Provide the patient with privacy if possible - Call for help and start treatment as ordered by the caregiver   After the Seizure (Postictal Stage)  After a seizure, most patients experience confusion, fatigue, muscle pain and/or a headache. Thus, one should permit the individual to sleep. For the next few days, reassurance is essential. Being calm and helping reorient the person is also of  importance.  Most seizures are painless and end spontaneously. Seizures are not harmful to others but can lead to complications such as stress on the lungs, brain and the heart. Individuals with prior lung problems may develop labored breathing and respiratory distress.    Discussed Patients with epilepsy have a small risk of sudden unexpected death, a condition referred to as sudden unexpected death in epilepsy (SUDEP). SUDEP is defined specifically as the sudden, unexpected, witnessed or unwitnessed, nontraumatic and nondrowning death in patients with epilepsy with or without evidence for a seizure, and excluding documented status epilepticus, in which post mortem examination does not reveal a structural or toxicologic cause for death     Orders Placed This Encounter  Procedures   MR BRAIN W WO CONTRAST   Levetiracetam  level   Valproic Acid  Level   CMP   Vitamin D , 25-hydroxy    Meds ordered this encounter  Medications   divalproex  (DEPAKOTE ) 500 MG DR tablet    Sig: Take 1 tablet (500 mg total) by mouth 2 (two) times daily.    Dispense:  180 tablet    Refill:  3   levETIRAcetam  (KEPPRA ) 750 MG tablet    Sig: Take 2 tablets (1,500 mg total) by mouth 2 (two) times daily.    Dispense:  360 tablet    Refill:  3    Return in about 6 months (around 06/16/2024).    Pastor Falling, MD 12/15/2023, 9:18 AM  [facility] [address], [address] [place], [place] [address] [phone]

## 2023-12-16 LAB — COMPREHENSIVE METABOLIC PANEL WITH GFR
ALT: 8 IU/L (ref 0–32)
AST: 18 IU/L (ref 0–40)
Albumin: 4.4 g/dL (ref 3.9–4.9)
Alkaline Phosphatase: 61 IU/L (ref 44–121)
BUN/Creatinine Ratio: 18 (ref 9–23)
BUN: 18 mg/dL (ref 6–24)
Bilirubin Total: <0.2 mg/dL (ref 0.0–1.2)
CO2: 22 mmol/L (ref 20–29)
Calcium: 9.8 mg/dL (ref 8.7–10.2)
Chloride: 102 mmol/L (ref 96–106)
Creatinine, Ser: 1.02 mg/dL — ABNORMAL HIGH (ref 0.57–1.00)
Globulin, Total: 2.8 g/dL (ref 1.5–4.5)
Glucose: 88 mg/dL (ref 70–99)
Potassium: 4.4 mmol/L (ref 3.5–5.2)
Sodium: 139 mmol/L (ref 134–144)
Total Protein: 7.2 g/dL (ref 6.0–8.5)
eGFR: 67 mL/min/1.73 (ref >59)

## 2023-12-16 LAB — VITAMIN D 25 HYDROXY (VIT D DEFICIENCY, FRACTURES): Vit D, 25-Hydroxy: 40.3 ng/mL (ref 30.0–100.0)

## 2023-12-16 LAB — LEVETIRACETAM LEVEL: Levetiracetam Lvl: 16.5 ug/mL (ref 10.0–40.0)

## 2023-12-16 LAB — VALPROIC ACID LEVEL: Valproic Acid Lvl: 12 ug/mL — ABNORMAL LOW (ref 50–100)

## 2023-12-17 ENCOUNTER — Ambulatory Visit: Payer: Self-pay | Admitting: Neurology

## 2023-12-17 ENCOUNTER — Telehealth: Payer: Self-pay | Admitting: Neurology

## 2023-12-17 NOTE — Telephone Encounter (Signed)
 [facility] [place]: [phone] exp. 12/17/23-02/14/24 sent to [facility] [phone]

## 2023-12-19 ENCOUNTER — Ambulatory Visit (HOSPITAL_COMMUNITY)
Admission: RE | Admit: 2023-12-19 | Discharge: 2023-12-19 | Disposition: A | Discharge status: Home / Self Care | Source: Ambulatory Visit | Attending: Neurology | Admitting: Neurology

## 2023-12-19 DIAGNOSIS — R569 Unspecified convulsions: Secondary | ICD-10-CM

## 2023-12-19 DIAGNOSIS — G40909 Epilepsy, unspecified, not intractable, without status epilepticus: Secondary | ICD-10-CM | POA: Insufficient documentation

## 2023-12-19 MED ORDER — GADOBUTROL 1 MMOL/ML IV SOLN
7.0000 mL | Freq: Once | INTRAVENOUS | Status: AC | PRN
  Administered 2023-12-19: 7 mL via INTRAVENOUS

## 2023-12-23 DIAGNOSIS — D170 Benign lipomatous neoplasm of skin and subcutaneous tissue of head, face and neck: Secondary | ICD-10-CM | POA: Insufficient documentation

## 2023-12-23 DIAGNOSIS — J309 Allergic rhinitis, unspecified: Secondary | ICD-10-CM | POA: Insufficient documentation

## 2023-12-25 ENCOUNTER — Emergency Department (HOSPITAL_COMMUNITY)

## 2023-12-25 ENCOUNTER — Encounter (HOSPITAL_COMMUNITY): Payer: Self-pay | Admitting: Emergency Medicine

## 2023-12-25 ENCOUNTER — Emergency Department (HOSPITAL_COMMUNITY)
Admission: EM | Admit: 2023-12-25 | Discharge: 2023-12-25 | Disposition: A | Discharge status: Home / Self Care | Attending: Emergency Medicine | Admitting: Emergency Medicine

## 2023-12-25 ENCOUNTER — Other Ambulatory Visit: Payer: Self-pay

## 2023-12-25 DIAGNOSIS — R748 Abnormal levels of other serum enzymes: Secondary | ICD-10-CM | POA: Insufficient documentation

## 2023-12-25 DIAGNOSIS — D72829 Elevated white blood cell count, unspecified: Secondary | ICD-10-CM | POA: Diagnosis not present

## 2023-12-25 DIAGNOSIS — J0100 Acute maxillary sinusitis, unspecified: Secondary | ICD-10-CM | POA: Insufficient documentation

## 2023-12-25 DIAGNOSIS — N83201 Unspecified ovarian cyst, right side: Secondary | ICD-10-CM | POA: Diagnosis not present

## 2023-12-25 DIAGNOSIS — R0602 Shortness of breath: Secondary | ICD-10-CM | POA: Insufficient documentation

## 2023-12-25 DIAGNOSIS — R197 Diarrhea, unspecified: Secondary | ICD-10-CM | POA: Insufficient documentation

## 2023-12-25 DIAGNOSIS — R519 Headache, unspecified: Secondary | ICD-10-CM | POA: Diagnosis present

## 2023-12-25 LAB — CBC WITH DIFFERENTIAL/PLATELET
Abs Immature Granulocytes: 0.06 K/uL (ref 0.00–0.07)
Basophils Absolute: 0.1 K/uL (ref 0.0–0.1)
Basophils Relative: 1 %
Eosinophils Absolute: 0.3 K/uL (ref 0.0–0.5)
Eosinophils Relative: 2 %
HCT: 45.6 % (ref 36.0–46.0)
Hemoglobin: 15.2 g/dL — ABNORMAL HIGH (ref 12.0–15.0)
Immature Granulocytes: 1 %
Lymphocytes Relative: 30 %
Lymphs Abs: 3.9 K/uL (ref 0.7–4.0)
MCH: 30.6 pg (ref 26.0–34.0)
MCHC: 33.3 g/dL (ref 30.0–36.0)
MCV: 91.9 fL (ref 80.0–100.0)
Monocytes Absolute: 1.1 K/uL — ABNORMAL HIGH (ref 0.1–1.0)
Monocytes Relative: 9 %
Neutro Abs: 7.7 K/uL (ref 1.7–7.7)
Neutrophils Relative %: 57 %
Platelets: 291 K/uL (ref 150–400)
RBC: 4.96 MIL/uL (ref 3.87–5.11)
RDW: 13.3 % (ref 11.5–15.5)
WBC: 13.1 K/uL — ABNORMAL HIGH (ref 4.0–10.5)
nRBC: 0 % (ref 0.0–0.2)

## 2023-12-25 LAB — COMPREHENSIVE METABOLIC PANEL WITH GFR
ALT: 10 U/L (ref 0–44)
AST: 17 U/L (ref 15–41)
Albumin: 4.2 g/dL (ref 3.5–5.0)
Alkaline Phosphatase: 54 U/L (ref 38–126)
Anion gap: 14 (ref 5–15)
BUN: 24 mg/dL — ABNORMAL HIGH (ref 6–20)
CO2: 24 mmol/L (ref 22–32)
Calcium: 9.7 mg/dL (ref 8.9–10.3)
Chloride: 101 mmol/L (ref 98–111)
Creatinine, Ser: 0.91 mg/dL (ref 0.44–1.00)
GFR, Estimated: >60 mL/min (ref >60)
Glucose, Bld: 80 mg/dL (ref 70–99)
Potassium: 4.1 mmol/L (ref 3.5–5.1)
Sodium: 139 mmol/L (ref 135–145)
Total Bilirubin: 0.4 mg/dL (ref 0.0–1.2)
Total Protein: 7.7 g/dL (ref 6.5–8.1)

## 2023-12-25 LAB — RESP PANEL BY RT-PCR (RSV, FLU A&B, COVID)  RVPGX2
Influenza A by PCR: NEGATIVE
Influenza B by PCR: NEGATIVE
Resp Syncytial Virus by PCR: NEGATIVE
SARS Coronavirus 2 by RT PCR: NEGATIVE

## 2023-12-25 LAB — URINALYSIS, ROUTINE W REFLEX MICROSCOPIC
Bilirubin Urine: NEGATIVE
Glucose, UA: NEGATIVE mg/dL
Hgb urine dipstick: NEGATIVE
Ketones, ur: NEGATIVE mg/dL
Leukocytes,Ua: NEGATIVE
Nitrite: NEGATIVE
Protein, ur: NEGATIVE mg/dL
Specific Gravity, Urine: 1.005 (ref 1.005–1.030)
pH: 8 (ref 5.0–8.0)

## 2023-12-25 LAB — WET PREP, GENITAL
Clue Cells Wet Prep HPF POC: NONE SEEN
Sperm: NONE SEEN
Trich, Wet Prep: NONE SEEN
WBC, Wet Prep HPF POC: <10 (ref <10)
Yeast Wet Prep HPF POC: NONE SEEN

## 2023-12-25 LAB — TROPONIN I (HIGH SENSITIVITY)
Troponin I (High Sensitivity): 2 ng/L (ref <18)
Troponin I (High Sensitivity): 2 ng/L (ref <18)

## 2023-12-25 LAB — VALPROIC ACID LEVEL: Valproic Acid Lvl: 88 ug/mL (ref 50–100)

## 2023-12-25 LAB — CK: Total CK: 62 U/L (ref 38–234)

## 2023-12-25 LAB — HCG, SERUM, QUALITATIVE: Preg, Serum: NEGATIVE

## 2023-12-25 LAB — D-DIMER, QUANTITATIVE: D-Dimer, Quant: 0.3 ug{FEU}/mL (ref 0.00–0.50)

## 2023-12-25 LAB — LIPASE, BLOOD: Lipase: 66 U/L — ABNORMAL HIGH (ref 11–51)

## 2023-12-25 MED ORDER — SODIUM CHLORIDE 0.9 % IV SOLN
1.0000 g | Freq: Once | INTRAVENOUS | Status: AC
  Administered 2023-12-25: 1 g via INTRAVENOUS
  Filled 2023-12-25: qty 10

## 2023-12-25 MED ORDER — PROCHLORPERAZINE EDISYLATE 10 MG/2ML IJ SOLN
10.0000 mg | Freq: Once | INTRAMUSCULAR | Status: AC
  Administered 2023-12-25: 10 mg via INTRAVENOUS
  Filled 2023-12-25: qty 2

## 2023-12-25 MED ORDER — DROPERIDOL 2.5 MG/ML IJ SOLN
1.2500 mg | Freq: Once | INTRAMUSCULAR | Status: AC
  Administered 2023-12-25: 1.25 mg via INTRAVENOUS
  Filled 2023-12-25: qty 2

## 2023-12-25 MED ORDER — DOXYCYCLINE HYCLATE 100 MG PO TABS
100.0000 mg | ORAL_TABLET | Freq: Once | ORAL | Status: AC
  Administered 2023-12-25: 100 mg via ORAL
  Filled 2023-12-25: qty 1

## 2023-12-25 MED ORDER — IOHEXOL 300 MG/ML  SOLN
100.0000 mL | Freq: Once | INTRAMUSCULAR | Status: AC | PRN
  Administered 2023-12-25: 100 mL via INTRAVENOUS

## 2023-12-25 MED ORDER — MORPHINE SULFATE (PF) 4 MG/ML IV SOLN
4.0000 mg | Freq: Once | INTRAVENOUS | Status: AC
  Administered 2023-12-25: 4 mg via INTRAVENOUS
  Filled 2023-12-25: qty 1

## 2023-12-25 MED ORDER — ONDANSETRON 4 MG PO TBDP: 4.0000 mg | ORAL_TABLET | Freq: Three times a day (TID) | ORAL | 0 refills | Status: DC | PRN

## 2023-12-25 MED ORDER — NAPROXEN 500 MG PO TABS: 500.0000 mg | ORAL_TABLET | Freq: Two times a day (BID) | ORAL | 0 refills | Status: DC

## 2023-12-25 MED ORDER — DIPHENHYDRAMINE HCL 50 MG/ML IJ SOLN
25.0000 mg | Freq: Once | INTRAMUSCULAR | Status: AC
  Administered 2023-12-25: 25 mg via INTRAVENOUS
  Filled 2023-12-25: qty 1

## 2023-12-25 MED ORDER — SODIUM CHLORIDE 0.9 % IV BOLUS
1000.0000 mL | Freq: Once | INTRAVENOUS | Status: AC
  Administered 2023-12-25: 1000 mL via INTRAVENOUS

## 2023-12-25 MED ORDER — DOXYCYCLINE HYCLATE 100 MG PO CAPS: 100.0000 mg | ORAL_CAPSULE | Freq: Two times a day (BID) | ORAL | 0 refills | Status: AC

## 2023-12-25 MED ORDER — KETOROLAC TROMETHAMINE 15 MG/ML IJ SOLN
15.0000 mg | Freq: Once | INTRAMUSCULAR | Status: AC
  Administered 2023-12-25: 15 mg via INTRAVENOUS
  Filled 2023-12-25: qty 1

## 2023-12-25 NOTE — Discharge Instructions (Signed)
 Please take all antibiotics as directed.  Please follow-up closely with gynecology as discussed at the bedside.  Please follow-up closely with ENT as well as discussed at the bedside.  Return to emergency department immediately for any new or worsening symptoms.

## 2023-12-25 NOTE — ED Provider Notes (Signed)
 [place] EMERGENCY DEPARTMENT AT [facility] Provider Note   CSN: [id number] Arrival date & time: 12/25/23  [id number]     Patient presents with: multiple complaints   Yvette [name] is a 48 y.o. female.   Patient is a 48 year old female who presents to Emergency Department with a chief complaint of headache, body aches, abdominal pain, chest pain, shortness of breath.  Patient notes his symptoms began approximately an hour and a half ago.  She notes that she does have a history of headaches and notes that this feels different than previous.  She denies any associated neck or back pain.  Patient notes that she has been experiencing some pain to the right side of the abdomen which has been ongoing for approximate the past 2 days.  She does admit to associated diarrhea without nausea or vomiting.  She denies any associated dizziness, lightheadedness or syncope.  She does note that her neurologist recently went up on her Keppra  and Depakote .  She denies any associated vision or hearing changes.  She has had no recent falls or blunt trauma.        Prior to Admission medications   Medication Sig Start Date End Date Taking? Authorizing Provider  albuterol (VENTOLIN HFA) 108 (90 Base) MCG/ACT inhaler Inhale 1-2 puffs into the lungs every 6 (six) hours as needed for wheezing or shortness of breath.    [provider]  divalproex  (DEPAKOTE ) 500 MG DR tablet Take 1 tablet (500 mg total) by mouth 2 (two) times daily. 12/15/23 12/09/24  Camara, Amadou, MD  hydrocortisone 2.5 % cream Apply 1 Application topically 2 (two) times daily. 02/24/23   [provider]  hydrOXYzine (VISTARIL) 25 MG capsule Take 25 mg by mouth every 6 (six) hours as needed for anxiety. 02/03/23   [provider]  ibuprofen  (ADVIL ) 600 MG tablet Take 1 tablet (600 mg total) by mouth every 6 (six) hours as needed. 03/04/23   Suellen Cantor A, PA-C  levETIRAcetam  (KEPPRA ) 750 MG tablet Take 2  tablets (1,500 mg total) by mouth 2 (two) times daily. 12/15/23 12/09/24  Camara, Amadou, MD  losartan (COZAAR) 50 MG tablet Take 50 mg by mouth daily. 12/25/22   [provider]  SYMBICORT 80-4.5 MCG/ACT inhaler Inhale 2 puffs into the lungs in the morning and at bedtime. 12/24/22   [provider]    Allergies: Acetaminophen, Lisinopril , and Percocet [oxycodone-acetaminophen]    Review of Systems  Cardiovascular:  Positive for chest pain.  Gastrointestinal:  Positive for abdominal pain and diarrhea.  Musculoskeletal:  Positive for myalgias.  Neurological:  Positive for headaches.  All other systems reviewed and are negative.   Updated Vital Signs BP (!) 167/105   Pulse 68   Temp 98.3 F (36.8 C) (Oral)   Resp 20   LMP 02/23/2020 (Approximate) Comment: tubal ligation, no period in 3 months.  SpO2 100%   Physical Exam Vitals and nursing note reviewed. Exam conducted with a chaperone present.  Constitutional:      General: She is not in acute distress.    Appearance: Normal appearance. She is not ill-appearing.  HENT:     Head: Normocephalic and atraumatic.     Nose: Nose normal.     Mouth/Throat:     Mouth: Mucous membranes are moist.  Eyes:     Extraocular Movements: Extraocular movements intact.     Conjunctiva/sclera: Conjunctivae normal.     Pupils: Pupils are equal, round, and reactive to light.  Cardiovascular:  Rate and Rhythm: Normal rate and regular rhythm.     Pulses: Normal pulses.     Heart sounds: Normal heart sounds. No murmur heard.    No gallop.  Pulmonary:     Effort: Pulmonary effort is normal. No respiratory distress.     Breath sounds: Normal breath sounds. No stridor. No wheezing, rhonchi or rales.  Abdominal:     General: Abdomen is flat. Bowel sounds are normal. There is no distension.     Palpations: Abdomen is soft.     Tenderness: There is no guarding.     Comments: Tender to palpation to right side of abdomen   Genitourinary:    Comments: Pelvic exam performed with tech chaperone MacKenzie  No vaginal bleeding or discharge, no swelling of the vulva, no perineal swelling or rash, no cervical motion tenderness, right adnexal tenderness Musculoskeletal:        General: Normal range of motion.     Cervical back: Normal range of motion and neck supple. No rigidity or tenderness.  Skin:    General: Skin is warm and dry.     Findings: No bruising or rash.  Neurological:     General: No focal deficit present.     Mental Status: She is alert and oriented to person, place, and time. Mental status is at baseline.     Cranial Nerves: No cranial nerve deficit.     Sensory: No sensory deficit.     Motor: No weakness.     Coordination: Coordination normal.     Gait: Gait normal.  Psychiatric:        Mood and Affect: Mood normal.        Behavior: Behavior normal.        Thought Content: Thought content normal.        Judgment: Judgment normal.     (all labs ordered are listed, but only abnormal results are displayed) Labs Reviewed  RESP PANEL BY RT-PCR (RSV, FLU A&B, COVID)  [id number]  COMPREHENSIVE METABOLIC PANEL WITH GFR  LIPASE, BLOOD  CBC WITH DIFFERENTIAL/PLATELET  URINALYSIS, ROUTINE W REFLEX MICROSCOPIC  D-DIMER, QUANTITATIVE  LEVETIRACETAM  LEVEL  VALPROIC ACID  LEVEL  HCG, SERUM, QUALITATIVE  TROPONIN I (HIGH SENSITIVITY)    EKG: EKG Interpretation Date/Time:  Thursday December 25 2023 10:11:20 EDT Ventricular Rate:  75 PR Interval:  119 QRS Duration:  90 QT Interval:  375 QTC Calculation: 419 R Axis:   63  Text Interpretation: Sinus rhythm Borderline short PR interval No significant change since last tracing Confirmed by Dean Clarity [phone]) on 12/25/2023 10:14:15 AM  Radiology: No results found.   Procedures   Medications Ordered in the ED  prochlorperazine  (COMPAZINE ) injection 10 mg (has no administration in time range)  diphenhydrAMINE  (BENADRYL ) injection 25 mg (has  no administration in time range)  sodium chloride  0.9 % bolus 1,000 mL (has no administration in time range)  morphine  (PF) 4 MG/ML injection 4 mg (has no administration in time range)                                    Medical Decision Making Amount and/or Complexity of Data Reviewed Labs: ordered. Radiology: ordered.  Risk Prescription drug management.   This patient presents to the ED for concern of headache, chest pain, abdominal pain differential diagnosis includes intracranial hemorrhage, migraine, tension headache, sinusitis, subarachnoid hemorrhage, space-occupying lesion, meningitis, ACS, pulmonary embolus, pericarditis, myocarditis, endocarditis, pneumonia,  pneumothorax, hemothorax, aortic aneurysm or dissection, acute appendicitis, cholecystitis, bowel torsion, diverticulitis, ovarian torsion or cyst, PID, tubo-ovarian abscess, pyelonephritis, kidney stone, pancreatitis, mesenteric ischemia    Additional history obtained:  Additional history obtained from family External records from outside source obtained and reviewed including medical records   Lab Tests:  I Ordered, and personally interpreted labs.  The pertinent results include: Mild leukocytosis, no anemia, normal kidney function liver function, normal electrolytes, negative serial troponins, negative CK, unremarkable urinalysis, mild elevation of lipase, negative viral swab, valproic acid  within normal limits, unremarkable wet prep   Imaging Studies ordered:  I ordered imaging studies including CT scan head, CT abdomen and pelvis, pelvic ultrasound, chest x-ray I independently visualized and interpreted imaging which showed no acute intracranial hemorrhage, opacification of the left maxillary sinus, fluid-filled structure along the right adnexa, no acute cardiopulmonary process I agree with the radiologist interpretation   Medicines ordered and prescription drug management:  I ordered medication including  morphine , Compazine , Benadryl , IV fluids, Toradol , droperidol , Rocephin , doxycycline  for headache, abdominal pain, possible tubo-ovarian abscess versus cyst Reevaluation of the patient after these medicines showed that the patient improved I have reviewed the patients home medicines and have made adjustments as needed   Problem List / ED Course:  Patient is doing well at this time and is stable for discharge home.  Did discuss the CT scan findings as well as the ultrasound findings with Dr. Jayne with gynecology who did recommend placing the patient on Rocephin  and doxycycline  and had her follow-up closely with him in his office next week for reevaluation.  He stated that he suspected that this is secondary to a periovarian cyst and not tubo-ovarian abscess.  Patient's white blood cell count is consistent with previous.  Discussed the importance of follow-up with gynecology and patient notes that she will do so.  Also discussed the findings of the opacification of her maxillary sinus.  She was provided the copy of her CT scan report and was directed to follow-up with ENT for direct visualization.  Patient notes that she will follow-up as directed.  Pain has greatly improved with treatment in the emergency department.  She had no other acute surgical process noted on CT scan of the abdomen and pelvis.  Head CT was unremarkable and was obtained within 6 hours of onset.  Low suspicion for etiology such as subarachnoid hemorrhage.  Do not suspect any further advanced imaging is warranted at this time.  Patient had an EKG with no acute ischemic changes as well as negative serial troponins.  Do not suspect ACS.  Chest x-ray was unremarkable or any indication for pneumonia, pneumothorax, hemothorax.  Symptoms are not positional in nature and do not suspect pericarditis or myocarditis.  No clinical indication for intracranial or more dissection or associated endocarditis.  The importance of close follow-up on  outpatient basis was discussed as well as strict turn precautions for any new or worsening symptoms.  Patient voiced understand to the plan and had no additional questions.   Social Determinants of Health:  None        Final diagnoses:  None    ED Discharge Orders     None          Daralene Lonni JONETTA DEVONNA 12/25/23 1719    Dean Clarity, MD 12/27/23 [phone]

## 2023-12-25 NOTE — ED Notes (Signed)
 Pt also has a Hx of HTN and states she did take her BP medication this morning

## 2023-12-25 NOTE — ED Triage Notes (Signed)
 Pt c/o CP, shob, HA in the back of the head, generalized body aches, abd pain x 1.5 hours ago. Rates pain 8/10. States her neurologist recently went up on both her seizure medications and she started feeling like this after taking her both her keppra  and depakote . Pt states she has not had any recent seizures lately. Hx of cardiac issues as well.

## 2023-12-25 NOTE — ED Notes (Signed)
 Pt reports improvement in symptoms with the exception of her headache. Headache remains 8-10 with worsening pain upon lateral rotation of her head.

## 2023-12-26 ENCOUNTER — Encounter: Payer: Self-pay | Admitting: Neurology

## 2023-12-26 LAB — GC/CHLAMYDIA PROBE AMP (~~LOC~~) NOT AT ARMC
Chlamydia: NEGATIVE
Comment: NEGATIVE
Comment: NORMAL
Neisseria Gonorrhea: NEGATIVE

## 2023-12-27 LAB — LEVETIRACETAM LEVEL: Levetiracetam Lvl: 79.6 ug/mL — ABNORMAL HIGH (ref 10.0–40.0)

## 2023-12-30 ENCOUNTER — Other Ambulatory Visit: Payer: Self-pay | Admitting: Neurology

## 2023-12-30 DIAGNOSIS — G40909 Epilepsy, unspecified, not intractable, without status epilepticus: Secondary | ICD-10-CM

## 2023-12-31 ENCOUNTER — Ambulatory Visit: Admitting: Neurology

## 2023-12-31 ENCOUNTER — Ambulatory Visit: Payer: Self-pay | Admitting: Neurology

## 2023-12-31 DIAGNOSIS — G40909 Epilepsy, unspecified, not intractable, without status epilepticus: Secondary | ICD-10-CM | POA: Diagnosis not present

## 2023-12-31 NOTE — Procedures (Signed)
    History:  48 year old woman with epilepsy   EEG classification: Awake and drowsy  Duration: 28 minutes   Technical aspects: This EEG study was done with scalp electrodes positioned according to the 10-20 International system of electrode placement. Electrical activity was reviewed with band pass filter of 1-70Hz , sensitivity of 7 uV/mm, display speed of 50mm/sec with a 60Hz  notched filter applied as appropriate. EEG data were recorded continuously and digitally stored.   Description of the recording: The background rhythms of this recording consists of a fairly well modulated medium amplitude alpha rhythm of 10 Hz that is reactive to eye opening and closure. Present in the anterior head region is a 15-20 Hz beta activity. Photic stimulation was performed, did not show any abnormalities. Hyperventilation was also performed, did not show any abnormalities. Drowsiness was manifested by background fragmentation. No abnormal epileptiform discharges seen during this recording. There was intermittent right temporal focal slowing. There were no electrographic seizure identified.   Abnormality: Intermittent right temporal focal slowing  Impression: This EEG is suggestive of neuronal dysfunction in the right temporal region. There were no seizures or epileptiform discharges seen during this recording.    Meliza Kage, MD [facility]

## 2024-01-01 DIAGNOSIS — J0100 Acute maxillary sinusitis, unspecified: Secondary | ICD-10-CM | POA: Insufficient documentation

## 2024-01-01 DIAGNOSIS — J322 Chronic ethmoidal sinusitis: Secondary | ICD-10-CM | POA: Insufficient documentation

## 2024-01-01 DIAGNOSIS — N9489 Other specified conditions associated with female genital organs and menstrual cycle: Secondary | ICD-10-CM | POA: Insufficient documentation

## 2024-01-12 ENCOUNTER — Other Ambulatory Visit: Payer: Self-pay | Admitting: Neurology

## 2024-01-12 ENCOUNTER — Other Ambulatory Visit: Payer: Self-pay | Admitting: *Deleted

## 2024-01-12 DIAGNOSIS — R2232 Localized swelling, mass and lump, left upper limb: Secondary | ICD-10-CM

## 2024-01-12 MED ORDER — CENOBAMATE 14 X 50 MG & 14 X100 MG PO TBPK: ORAL_TABLET | ORAL | 0 refills | Status: AC

## 2024-01-12 MED ORDER — CENOBAMATE 14 X 12.5 MG & 14 X 25 MG PO TBPK: ORAL_TABLET | ORAL | 0 refills | Status: DC

## 2024-01-13 ENCOUNTER — Ambulatory Visit: Admitting: Surgery

## 2024-01-14 ENCOUNTER — Encounter: Admitting: Adult Health

## 2024-01-29 ENCOUNTER — Other Ambulatory Visit: Payer: Self-pay | Admitting: Neurology

## 2024-01-29 MED ORDER — CENOBAMATE 14 X 12.5 MG & 14 X 25 MG PO TBPK: ORAL_TABLET | ORAL | 0 refills | Status: DC

## 2024-02-19 ENCOUNTER — Encounter: Payer: Self-pay | Admitting: Adult Health

## 2024-02-19 ENCOUNTER — Ambulatory Visit: Admitting: Adult Health

## 2024-02-19 ENCOUNTER — Other Ambulatory Visit (HOSPITAL_COMMUNITY)
Admission: RE | Admit: 2024-02-19 | Discharge: 2024-02-19 | Disposition: A | Discharge status: Home / Self Care | Source: Ambulatory Visit | Attending: Adult Health | Admitting: Adult Health

## 2024-02-19 VITALS — BP 131/87 | HR 80 | Ht 60.0 in | Wt 142.0 lb

## 2024-02-19 DIAGNOSIS — Z124 Encounter for screening for malignant neoplasm of cervix: Secondary | ICD-10-CM

## 2024-02-19 DIAGNOSIS — N83201 Unspecified ovarian cyst, right side: Secondary | ICD-10-CM

## 2024-02-19 DIAGNOSIS — K8020 Calculus of gallbladder without cholecystitis without obstruction: Secondary | ICD-10-CM | POA: Insufficient documentation

## 2024-02-19 DIAGNOSIS — R1011 Right upper quadrant pain: Secondary | ICD-10-CM | POA: Diagnosis not present

## 2024-02-19 DIAGNOSIS — Z1331 Encounter for screening for depression: Secondary | ICD-10-CM

## 2024-02-19 DIAGNOSIS — R1031 Right lower quadrant pain: Secondary | ICD-10-CM

## 2024-02-19 MED ORDER — ONDANSETRON 4 MG PO TBDP: 4.0000 mg | ORAL_TABLET | Freq: Three times a day (TID) | ORAL | 1 refills | Status: DC | PRN

## 2024-02-19 NOTE — Progress Notes (Signed)
 Patient ID: Yvette [name], female   DOB: 06/04/1975, 48 y.o.   MRN: [id number] History of Present Illness: Yvette [name] is a 48 year old white female, with SO, PM referred by PCP for RLQ ?ovarian cyst, was seen in ER in August. And she needs a pap, had physical with PCP.  PCP is [employer] [facility]   Current Medications, Allergies, Past Medical History, Past Surgical History, Family History and Social History were reviewed in [employer] record.     Review of Systems: +RLQ pain on and off for 2-3 months +RUQ pain for about 6 months and has nausea Denies any vaginal bleeding Currently not having sex    Physical Exam:BP 131/87 (BP Location: Left Arm, Patient Position: Sitting, Cuff Size: Normal)   Pulse 80   Ht 5' (1.524 m)   Wt 142 lb (64.4 kg)   LMP 02/23/2020 (Approximate) Comment: tubal ligation, no period in 3 months.  BMI 27.73 kg/m   General:  Well developed, well nourished, no acute distress Skin:  Warm and dry Lungs; Clear to auscultation bilaterally Cardiovascular: Regular rate and rhythm Abdomen is soft has tenderness RUQ Pelvic:  External genitalia is normal in appearance, no lesions.  The vagina is pale. Urethra has no lesions or masses. The cervix is smooth, pap with HR HPV genotyping performed.  Uterus is felt to be normal size, shape, and contour.  No adnexal masses. +RLQ tenderness noted.Bladder is non tender, no masses felt. Extremities/musculoskeletal:  No swelling or varicosities noted, no clubbing or cyanosis Psych:  No mood changes, alert and cooperative,seems happy AA is 0 Fall risk is moderate    02/19/2024   11:13 AM  Depression screen PHQ 2/9  Decreased Interest 0  Down, Depressed, Hopeless 1  PHQ - 2 Score 1  Altered sleeping 0  Tired, decreased energy 1  Change in appetite 0  Feeling bad or failure about yourself  0  Trouble concentrating 0  Moving slowly or fidgety/restless 0  Suicidal thoughts 0  PHQ-9 Score 2        02/19/2024   11:13 AM  GAD 7 : Generalized Anxiety Score  Nervous, Anxious, on Edge 1  Control/stop worrying 1  Worry too much - different things 1  Trouble relaxing 0  Restless 2  Easily annoyed or irritable 2  Afraid - awful might happen 0  Total GAD 7 Score 7      Upstream - 02/19/24 1124       Pregnancy Intention Screening   Does the patient want to become pregnant in the next year? No    Does the patient's partner want to become pregnant in the next year? No    Would the patient like to discuss contraceptive options today? No      Contraception Wrap Up   Current Method Female Sterilization    End Method Female Sterilization    Contraception Counseling Provided No          Examination chaperoned by Clarita Salt LPN   Impression and plan: 1. Routine Papanicolaou smear Pap sent Pap in 3 years if normal  - Cytology - PAP( )  2. RLQ abdominal pain (Primary) +RLQ pain Will get US  02/27/24 at [facility] - US  PELVIC COMPLETE WITH TRANSVAGINAL; Future Will talk when results back  3. RUQ pain +RUQ pain and tenderness Had Gallstones on CT  ABD US  02/27/24 at 9:30 am  at [facility] to assess  - US  Abdomen Complete; Future  4. Gallstones Gallstones seen on  CT  ABD US  01/28/24 at 9:30 am at [facility] - US  Abdomen Complete; Future  5. Cyst of ovary, right US  10/24/ at [facility] to re assess  - US  PELVIC COMPLETE WITH TRANSVAGINAL; Future

## 2024-02-24 ENCOUNTER — Ambulatory Visit: Payer: Self-pay | Admitting: Adult Health

## 2024-02-24 LAB — CYTOLOGY - PAP
Comment: NEGATIVE
Diagnosis: NEGATIVE
High risk HPV: NEGATIVE

## 2024-02-27 ENCOUNTER — Ambulatory Visit (HOSPITAL_COMMUNITY)
Admission: RE | Admit: 2024-02-27 | Discharge: 2024-02-27 | Disposition: A | Discharge status: Home / Self Care | Source: Ambulatory Visit | Attending: Adult Health | Admitting: Adult Health

## 2024-02-27 DIAGNOSIS — R1031 Right lower quadrant pain: Secondary | ICD-10-CM | POA: Diagnosis present

## 2024-02-27 DIAGNOSIS — N83201 Unspecified ovarian cyst, right side: Secondary | ICD-10-CM | POA: Insufficient documentation

## 2024-02-27 DIAGNOSIS — K8020 Calculus of gallbladder without cholecystitis without obstruction: Secondary | ICD-10-CM

## 2024-02-27 DIAGNOSIS — R1011 Right upper quadrant pain: Secondary | ICD-10-CM | POA: Diagnosis present

## 2024-03-02 ENCOUNTER — Other Ambulatory Visit: Payer: Self-pay | Admitting: Adult Health

## 2024-03-02 DIAGNOSIS — R1011 Right upper quadrant pain: Secondary | ICD-10-CM

## 2024-03-02 DIAGNOSIS — K8020 Calculus of gallbladder without cholecystitis without obstruction: Secondary | ICD-10-CM

## 2024-03-02 NOTE — Progress Notes (Signed)
 Refer to general surgery for RUQ pain and gallstones

## 2024-03-11 ENCOUNTER — Encounter: Payer: Self-pay | Admitting: Surgery

## 2024-03-11 ENCOUNTER — Ambulatory Visit: Admitting: Surgery

## 2024-03-11 VITALS — BP 146/86 | HR 68 | Temp 98.2°F | Resp 14 | Ht 60.0 in | Wt 141.0 lb

## 2024-03-11 DIAGNOSIS — K8020 Calculus of gallbladder without cholecystitis without obstruction: Secondary | ICD-10-CM

## 2024-03-11 DIAGNOSIS — R569 Unspecified convulsions: Secondary | ICD-10-CM | POA: Diagnosis not present

## 2024-03-11 NOTE — Progress Notes (Unsigned)
 [facility] History and Physical  Reason for Referral: Cholelithiasis, left upper back cyst Referring Physician: Lyndal Norrie, PA-C  Chief Complaint   New Patient (Initial Visit)     Yvette [name] is a 48 y.o. female.  HPI: Patient presents for evaluation of cholelithiasis.  Starting about 7 months ago, she began having right upper quadrant abdominal pain with eating.  She presented to the emergency department for a headache and was advised to follow-up with her primary care doctor regarding her abdominal pain.  Her primary care doctor obtained an ultrasound which demonstrated gallstones.  She confirms having nausea with these episodes but denies ever having episodes of vomiting.  She is currently having regular bowel movements.  She also complains of a left upper back cyst that she potentially would like to have removed.  She has a past medical history significant for seizures, asthma, and hypertension.  She follows with neurology for her seizures, and her last seizure was 4 days ago.  She is on numerous medications to control her seizures.  She denies use of blood thinning medications.  She has had 3 C-sections in the past.  She smokes marijuana daily for her nausea.  She denies use of tobacco products and alcohol.  Past Medical History:  Diagnosis Date   Anemia    Asthma    Coronary artery disease    Hypertension    Seizures (HCC)     Past Surgical History:  Procedure Laterality Date   CAROTID STENT     placed in 2019   CESAREAN SECTION     8006,8001, 2004   TUBAL LIGATION      Family History  Problem Relation Age of Onset   Diabetes Maternal Grandmother    Hypertension Father    Diabetes Father    Other Brother        open heart surgery   Anemia Daughter     Social History   Tobacco Use   Smoking status: Never   Smokeless tobacco: Never  Vaping Use   Vaping status: Never Used  Substance Use Topics   Alcohol use: Not Currently   Drug  use: Yes    Types: Marijuana    Comment: once a night    Medications: I have reviewed the patient's current medications. Allergies as of 03/11/2024       Reactions   Acetaminophen Shortness Of Breath   Lisinopril  Cough   Percocet [oxycodone-acetaminophen] Shortness Of Breath   Tachycardia, sob        Medication List        Accurate as of March 11, 2024  2:22 PM. If you have any questions, ask your nurse or doctor.          albuterol 108 (90 Base) MCG/ACT inhaler Commonly known as: VENTOLIN HFA Inhale 1-2 puffs into the lungs every 6 (six) hours as needed for wheezing or shortness of breath.   Cenobamate  14 x 12.5 MG & 14 x 25 MG Tbpk Take 12.5 mg daily for 14 days and then increase to 25 mg daily for 14 days.   divalproex  500 MG DR tablet Commonly known as: DEPAKOTE  Take 1 tablet (500 mg total) by mouth 2 (two) times daily.   levETIRAcetam  750 MG tablet Commonly known as: KEPPRA  Take 2 tablets (1,500 mg total) by mouth 2 (two) times daily.   losartan 100 MG tablet Commonly known as: COZAAR TAKE 1 TABLET BY MOUTH ONCE DAILY AS DIRECTED FOR BLOOD PRESSURE   naproxen  500 MG  tablet Commonly known as: NAPROSYN  Take 1 tablet (500 mg total) by mouth 2 (two) times daily.   ondansetron  4 MG disintegrating tablet Commonly known as: ZOFRAN -ODT Take 1 tablet (4 mg total) by mouth every 8 (eight) hours as needed for nausea or vomiting.   Symbicort 80-4.5 MCG/ACT inhaler Generic drug: budesonide-formoterol Inhale 2 puffs into the lungs in the morning and at bedtime.         ROS:  Constitutional: negative for chills, fatigue, and fevers Eyes: positive for visual disturbance, negative for pain Ears, nose, mouth, throat, and face: positive for sinus problems, negative for ear drainage and sore throat Respiratory: negative for cough, wheezing, and shortness of breath Cardiovascular: negative for chest pain and palpitations Gastrointestinal: positive for abdominal  pain and nausea, negative for reflux symptoms and vomiting Genitourinary:negative for dysuria and frequency Integument/breast: negative for dryness and rash Hematologic/lymphatic: negative for bleeding and lymphadenopathy Musculoskeletal:negative for back pain and neck pain Neurological: negative for dizziness and tremors Endocrine: negative for temperature intolerance  Blood pressure (!) 146/86, pulse 68, temperature 98.2 F (36.8 C), temperature source Oral, resp. rate 14, height 5' (1.524 m), weight 141 lb (64 kg), last menstrual period 02/23/2020, SpO2 95%. Physical Exam Vitals reviewed.  Constitutional:      Appearance: Normal appearance.  HENT:     Head: Normocephalic and atraumatic.  Eyes:     Extraocular Movements: Extraocular movements intact.     Pupils: Pupils are equal, round, and reactive to light.  Cardiovascular:     Rate and Rhythm: Normal rate and regular rhythm.  Pulmonary:     Effort: Pulmonary effort is normal.     Breath sounds: Normal breath sounds.  Abdominal:     Comments: Abdomen soft, nondistended, no percussion tenderness, TTP in RUQ; no rigidity, guarding, or rebound tenderness; negative Murphy's sign  Musculoskeletal:        General: Normal range of motion.     Cervical back: Normal range of motion.  Skin:    General: Skin is warm and dry.     Comments: 2.5 cm left upper back cyst without induration, erythema, or tenderness  Neurological:     General: No focal deficit present.     Mental Status: She is alert and oriented to person, place, and time.  Psychiatric:        Mood and Affect: Mood normal.        Behavior: Behavior normal.     Results: Abdominal ultrasound (02/27/24): IMPRESSION: 1. cholelithiasis with largest stone 2.2 cm, borderline wall thickening, and trace pericholecystic fluid; no sonographic Murphy sign to suggest acute cholecystitis. 2. Right renal cortical scarring with multiple small nonobstructive calyceal calculi up to  3 mm.  Assessment & Plan:  Yvette [name] is a 48 y.o. female who presents for evaluation of cholelithiasis.  -We discussed the pathophysiology of cholelithiasis and the recommendations for cholecystectomy.  We also discussed sebaceous cysts -I counseled the patient about the indication, risks and benefits of robotic assisted laparoscopic cholecystectomy.  She understands there is a very small chance for bleeding, infection, injury to normal structures (including common bile duct), conversion to open surgery, persistent symptoms, evolution of postcholecystectomy diarrhea, need for secondary interventions, anesthesia reaction, cardiopulmonary issues and other risks not specifically detailed here. I described the expected recovery, the plan for follow-up and the restrictions during the recovery phase.  All questions were answered. -I discussed with the patient that we can also plan to excise her left upper back cyst at the time  of cholecystectomy.  The risk of this procedure were discussed with the patient including but not limited to bleeding, infection, injury to surrounding structures, need for additional procedures, and cyst recurrence. -Prior to scheduling the patient for surgery, we will reach out to neurology about her seizures.  Given that she had a seizure 4 days ago, anesthesia will want recommendations from neurology -Information provided to the patient regarding cholelithiasis, cholecystitis, cholecystectomy, and low fat diet -Advised patient to present to the ED if she begins to have worsening RUQ abdominal pain, nausea, vomiting, jaundice, fever or chills  All questions were answered to the satisfaction of the patient and family.  Note: Portions of this report may have been transcribed using voice recognition software. Every effort has been made to ensure accuracy; however, inadvertent computerized transcription errors may still be present.   Dorothyann Brittle, DO [facility] [address] [place], [place] [address] [phone] (office)

## 2024-03-30 ENCOUNTER — Other Ambulatory Visit: Payer: Self-pay | Admitting: Adult Health

## 2024-03-30 ENCOUNTER — Telehealth: Payer: Self-pay | Admitting: *Deleted

## 2024-03-30 ENCOUNTER — Encounter: Payer: Self-pay | Admitting: *Deleted

## 2024-03-30 DIAGNOSIS — K8020 Calculus of gallbladder without cholecystitis without obstruction: Secondary | ICD-10-CM

## 2024-03-30 DIAGNOSIS — R1011 Right upper quadrant pain: Secondary | ICD-10-CM

## 2024-03-30 NOTE — Telephone Encounter (Signed)
 Received new referral for RUQ pain, cholelithiasis.   New referral is not needed. Patient was seen on 03/11/2024. Patient was to contact office when she was ready to proceed with surgery.  Letter faxed to Neurology to obtain risk stratification due to recent seizures.

## 2024-03-30 NOTE — Progress Notes (Signed)
 Referred to [facility]

## 2024-03-31 ENCOUNTER — Encounter: Payer: Self-pay | Admitting: Neurology

## 2024-04-05 NOTE — Telephone Encounter (Signed)
 Received response from neurology:  Yvette [name], DOB 03-27-1976, is a patient of my neurology clinic who was last seen 12/15/2023.  Regarding peri-operative management of cholelithiasis ;  there is a small but acceptable risk of peri-operative seizure and TIA/Stroke.  I recommend to continue antiseizure medication through the morning of the surgery.  If you have any questions, or concerns, please don't hesitate to call.  Sincerely,  Dr Gregg

## 2024-04-06 NOTE — Telephone Encounter (Signed)
 Call placed to patient and patient made aware of clearance.   Patient would like to proceed with cholecystectomy and excision of upper back mass.

## 2024-04-22 NOTE — Addendum Note (Signed)
 Addended by: SAUNDRA TAWNI DEL on: 04/22/2024 03:20 PM   Modules accepted: Orders

## 2024-04-26 ENCOUNTER — Emergency Department (HOSPITAL_COMMUNITY)

## 2024-04-26 ENCOUNTER — Other Ambulatory Visit: Payer: Self-pay | Admitting: Neurology

## 2024-04-26 ENCOUNTER — Encounter (HOSPITAL_COMMUNITY): Payer: Self-pay | Admitting: Emergency Medicine

## 2024-04-26 ENCOUNTER — Emergency Department (HOSPITAL_COMMUNITY)
Admission: EM | Admit: 2024-04-26 | Discharge: 2024-04-26 | Disposition: A | Discharge status: Home / Self Care | Attending: Emergency Medicine | Admitting: Emergency Medicine

## 2024-04-26 ENCOUNTER — Other Ambulatory Visit: Payer: Self-pay

## 2024-04-26 DIAGNOSIS — M25552 Pain in left hip: Secondary | ICD-10-CM | POA: Insufficient documentation

## 2024-04-26 DIAGNOSIS — R569 Unspecified convulsions: Secondary | ICD-10-CM | POA: Insufficient documentation

## 2024-04-26 HISTORY — DX: Calculus of gallbladder without cholecystitis without obstruction: K80.20

## 2024-04-26 LAB — COMPREHENSIVE METABOLIC PANEL WITH GFR
ALT: 7 U/L (ref 0–44)
AST: 18 U/L (ref 15–41)
Albumin: 5 g/dL (ref 3.5–5.0)
Alkaline Phosphatase: 64 U/L (ref 38–126)
Anion gap: 18 — ABNORMAL HIGH (ref 5–15)
BUN: 17 mg/dL (ref 6–20)
CO2: 22 mmol/L (ref 22–32)
Calcium: 10.4 mg/dL — ABNORMAL HIGH (ref 8.9–10.3)
Chloride: 100 mmol/L (ref 98–111)
Creatinine, Ser: 0.91 mg/dL (ref 0.44–1.00)
GFR, Estimated: >60 mL/min
Glucose, Bld: 90 mg/dL (ref 70–99)
Potassium: 3.9 mmol/L (ref 3.5–5.1)
Sodium: 140 mmol/L (ref 135–145)
Total Bilirubin: 0.3 mg/dL (ref 0.0–1.2)
Total Protein: 8.5 g/dL — ABNORMAL HIGH (ref 6.5–8.1)

## 2024-04-26 LAB — CBC WITH DIFFERENTIAL/PLATELET
Abs Immature Granulocytes: 0.03 K/uL (ref 0.00–0.07)
Basophils Absolute: 0.1 K/uL (ref 0.0–0.1)
Basophils Relative: 1 %
Eosinophils Absolute: 0 K/uL (ref 0.0–0.5)
Eosinophils Relative: 0 %
HCT: 43.9 % (ref 36.0–46.0)
Hemoglobin: 14.8 g/dL (ref 12.0–15.0)
Immature Granulocytes: 0 %
Lymphocytes Relative: 20 %
Lymphs Abs: 2.4 K/uL (ref 0.7–4.0)
MCH: 31.7 pg (ref 26.0–34.0)
MCHC: 33.7 g/dL (ref 30.0–36.0)
MCV: 94 fL (ref 80.0–100.0)
Monocytes Absolute: 0.9 K/uL (ref 0.1–1.0)
Monocytes Relative: 8 %
Neutro Abs: 8.6 K/uL — ABNORMAL HIGH (ref 1.7–7.7)
Neutrophils Relative %: 71 %
Platelets: 312 K/uL (ref 150–400)
RBC: 4.67 MIL/uL (ref 3.87–5.11)
RDW: 13.2 % (ref 11.5–15.5)
WBC: 12.1 K/uL — ABNORMAL HIGH (ref 4.0–10.5)
nRBC: 0 % (ref 0.0–0.2)

## 2024-04-26 LAB — URINALYSIS, ROUTINE W REFLEX MICROSCOPIC
Bacteria, UA: NONE SEEN
Bilirubin Urine: NEGATIVE
Glucose, UA: NEGATIVE mg/dL
Hgb urine dipstick: NEGATIVE
Ketones, ur: 20 mg/dL — AB
Leukocytes,Ua: NEGATIVE
Nitrite: NEGATIVE
Protein, ur: 30 mg/dL — AB
Specific Gravity, Urine: 1.016 (ref 1.005–1.030)
pH: 7 (ref 5.0–8.0)

## 2024-04-26 LAB — URINE DRUG SCREEN
Amphetamines: NEGATIVE
Barbiturates: NEGATIVE
Benzodiazepines: NEGATIVE
Cocaine: NEGATIVE
Fentanyl: NEGATIVE
Methadone Scn, Ur: NEGATIVE
Opiates: NEGATIVE
Tetrahydrocannabinol: POSITIVE — AB

## 2024-04-26 LAB — PHOSPHORUS: Phosphorus: 2.3 mg/dL — ABNORMAL LOW (ref 2.5–4.6)

## 2024-04-26 LAB — CBG MONITORING, ED: Glucose-Capillary: 107 mg/dL — ABNORMAL HIGH (ref 70–99)

## 2024-04-26 LAB — MAGNESIUM: Magnesium: 2.1 mg/dL (ref 1.7–2.4)

## 2024-04-26 LAB — HCG, SERUM, QUALITATIVE: Preg, Serum: NEGATIVE

## 2024-04-26 LAB — ETHANOL: Alcohol, Ethyl (B): <15 mg/dL

## 2024-04-26 MED ORDER — LEVETIRACETAM (KEPPRA) 500 MG/5 ML ADULT IV PUSH
20.0000 mg/kg | Freq: Once | INTRAVENOUS | Status: AC
  Administered 2024-04-26: 1250 mg via INTRAVENOUS
  Filled 2024-04-26: qty 15

## 2024-04-26 MED ORDER — LACTATED RINGERS IV BOLUS
1000.0000 mL | Freq: Once | INTRAVENOUS | Status: AC
  Administered 2024-04-26: 1000 mL via INTRAVENOUS

## 2024-04-26 MED ORDER — CENOBAMATE 14 X 50 MG & 14 X100 MG PO TBPK: ORAL_TABLET | ORAL | 0 refills | Status: AC

## 2024-04-26 NOTE — Telephone Encounter (Signed)
 Requested Prescriptions   Pending Prescriptions Disp Refills   XCOPRI  14 x 12.5 MG & 14 x 25 MG TBPK [Pharmacy Med Name: Xcopri  14 x 12.5 MG & 14 x 25 MG Oral Tablet Therapy Pack] 28 tablet 0    Sig: TAKE AS DIRECTED ON  PACKAGE   Last seen 12/15/23 Next appt 08/11/24  Patient Instructions  Increase Keppra  to 1500 mg twice daily  Increase Depakote  to 500 mg twice daily  Continue your other medications   Dispenses   Dispensed Days Supply Quantity Provider Pharmacy  XCOPRI  12.5-25 PAK 03/12/2024 21 28 each Gregg Lek, MD [facility] [phone] ...  XCOPRI  12.5-25 PAK 01/27/2024 28 28 each Gregg Lek, MD [facility] [phone] .SABRASABRA

## 2024-04-26 NOTE — ED Triage Notes (Signed)
 Pt bib [facility] for ?seizure/syncopal episode from [facility]. Pt alert and oriented on arrival to hospital.

## 2024-04-26 NOTE — Discharge Instructions (Signed)
 You were seen for your seizure in the emergency department.   At home, please continue to take the seizure medication that you have been prescribed.  Refrain from activities that could be dangerous when you have a seizure such as climbing a ladder or swimming.  Do not drive for at least 6 months.  Talk to your neurologist to see when it is safe to resume these activities.    Check your MyChart online for the results of any tests that had not resulted by the time you left the emergency department.   Follow-up with your primary doctor in 2-3 days regarding your visit.  Follow-up with your neurologist in 1 week.  Return immediately to the emergency department if you experience any of the following: Seizures lasting more than 5 minutes, or any other concerning symptoms.    Thank you for visiting our Emergency Department. It was a pleasure taking care of you today.

## 2024-04-26 NOTE — ED Provider Notes (Signed)
 " Yvette [name]   Yvette [name] Yvette [name]  1655     Patient presents with: No chief complaint on file.   Yvette [name] is a 48 y.o. female.   48 year old female history of seizures on Keppra , Depakote , and Xcopri  who presents to the emergency department for seizure.  Reports that she has been out of her seizure medications for the past 3 days and has had multiple seizures at home.  Had 1 earlier this morning that was an absence seizure.  Per EMS was at [facility] and was being approached by police and had another seizure.  Says that in the past few days she has fallen and hurt her hand and left hip from seizures.  Did hit her head and has a mild headache since then.  No alcohol use.  Smokes marijuana but denies any other drug use.       Prior to Admission medications  Medication Sig Start Date End Date Taking? Authorizing Yvette [name]  albuterol (VENTOLIN HFA) 108 (90 Base) MCG/ACT inhaler Inhale 1-2 puffs into the lungs every 6 (six) hours as needed for wheezing or shortness of breath.    Yvette [name], Historical, Yvette [name]  Cenobamate  14 x 50 MG & 14 x100 MG TBPK Take 50 mg by mouth daily at 12 noon for 14 days, THEN 100 mg daily at 12 noon for 14 days. [name] 05/24/24  Yvette [name], Yvette [name], Yvette [name]  divalproex  (DEPAKOTE ) 500 MG DR tablet Take 1 tablet (500 mg total) by mouth 2 (two) times daily. 12/15/23 12/09/24  Yvette [name], Yvette [name], Yvette [name]  levETIRAcetam  (KEPPRA ) 750 MG tablet Take 2 tablets (1,500 mg total) by mouth 2 (two) times daily. 12/15/23 12/09/24  Yvette [name], Yvette [name], Yvette [name]  losartan (COZAAR) 100 MG tablet TAKE 1 TABLET BY MOUTH ONCE DAILY AS DIRECTED FOR BLOOD PRESSURE 12/23/23   Yvette [name], Historical, Yvette [name]  naproxen  (NAPROSYN ) 500 MG tablet Take 1 tablet (500 mg total) by mouth 2 (two) times daily. 12/25/23   Yvette [name], Yvette [name]  ondansetron  (ZOFRAN -ODT) 4 MG disintegrating tablet Take 1 tablet (4 mg total) by mouth every 8 (eight) hours  as needed for nausea or vomiting. 02/19/24   Yvette [name], Yvette [name]  SYMBICORT 80-4.5 MCG/ACT inhaler Inhale 2 puffs into the lungs in the morning and at bedtime. 12/24/22   Yvette [name], Historical, Yvette [name]    Allergies: Acetaminophen, Lisinopril , and Percocet [oxycodone-acetaminophen]    Review of Systems  Updated Vital Signs BP (!) 156/95   Pulse 91   Temp 98.4 F (36.9 C) (Oral)   Resp 12   Ht 5' (1.524 m)   Wt 64 kg   LMP 02/23/2020 Comment: tubal ligation, no period in 3 months.  SpO2 98%   BMI 27.56 kg/m   Physical Exam Constitutional:      Appearance: Normal appearance.  HENT:     Head: Normocephalic and atraumatic.     Right Ear: External ear normal.     Left Ear: External ear normal.     Nose: Nose normal.     Mouth/Throat:     Mouth: Mucous membranes are moist.     Pharynx: Oropharynx is clear.  Eyes:     Comments: Pupils 4 mm bilaterally  Neck:     Comments: Midline C-spine tenderness to palpation at approximately C2 Cardiovascular:     Rate and Rhythm: Normal rate and regular rhythm.     Pulses: Normal pulses.     Heart sounds: Normal heart sounds.  Pulmonary:  Effort: Pulmonary effort is normal.     Breath sounds: Normal breath sounds.  Musculoskeletal:     Comments: Tender palpation of left hip.  No obvious deformity of the right hand.  Neurological:     Mental Status: She is alert and oriented to person, place, and time.     Cranial Nerves: No cranial nerve deficit.     Sensory: No sensory deficit.     Motor: No weakness.     (all labs ordered are listed, but only abnormal results are displayed) Labs Reviewed  COMPREHENSIVE METABOLIC PANEL WITH GFR - Abnormal; Notable for the following components:      Result Value   Calcium 10.4 (*)    Total Protein 8.5 (*)    Anion gap 18 (*)    All other components within normal limits  CBC WITH DIFFERENTIAL/PLATELET - Abnormal; Notable for the following components:   WBC 12.1 (*)    Neutro Abs 8.6 (*)     All other components within normal limits  PHOSPHORUS - Abnormal; Notable for the following components:   Phosphorus 2.3 (*)    All other components within normal limits  URINALYSIS, ROUTINE W REFLEX MICROSCOPIC - Abnormal; Notable for the following components:   APPearance HAZY (*)    Ketones, ur 20 (*)    Protein, ur 30 (*)    All other components within normal limits  URINE DRUG SCREEN - Abnormal; Notable for the following components:   Tetrahydrocannabinol POSITIVE (*)    All other components within normal limits  CBG MONITORING, ED - Abnormal; Notable for the following components:   Glucose-Capillary 107 (*)    All other components within normal limits  MAGNESIUM  ETHANOL  HCG, SERUM, QUALITATIVE    EKG: EKG Interpretation Date/Time:  Monday April 26 2024 17:39:24 EST Ventricular Rate:  109 PR Interval:  134 QRS Duration:  81 QT Interval:  317 QTC Calculation: 427 R Axis:   52  Text Interpretation: Sinus tachycardia Confirmed by Yvette [name] [phone]) on 04/26/2024 5:48:00 PM  Radiology: CT Cervical Spine Wo Contrast Result Date: 04/26/2024 CLINICAL DATA:  Provided history: Neck trauma, midline tenderness (Age 30-64y) EXAM: CT CERVICAL SPINE WITHOUT CONTRAST TECHNIQUE: Multidetector CT imaging of the cervical spine was performed without intravenous contrast. Multiplanar CT image reconstructions were also generated. RADIATION DOSE REDUCTION: This exam was performed according to the departmental dose-optimization program which includes automated exposure control, adjustment of the mA and/or kV according to patient size and/or use of iterative reconstruction technique. COMPARISON:  06/19/2019 FINDINGS: Alignment: Normal. Skull base and vertebrae: No acute fracture. Vertebral body heights are maintained. The dens and skull base are intact. Soft tissues and spinal canal: No prevertebral fluid or swelling. No visible canal hematoma. Disc levels: Minor anterior spurring at C4-C5  and C5-C6, unchanged from prior. Upper chest: Negative. Other: None. IMPRESSION: No fracture or subluxation of the cervical spine. Electronically Signed   By: Andrea Gasman M.D.   On: 04/26/2024 20:32   CT Head Wo Contrast Result Date: 04/26/2024 CLINICAL DATA:  Seizure, fall. EXAM: CT HEAD WITHOUT CONTRAST TECHNIQUE: Contiguous axial images were obtained from the base of the skull through the vertex without intravenous contrast. RADIATION DOSE REDUCTION: This exam was performed according to the departmental dose-optimization program which includes automated exposure control, adjustment of the mA and/or kV according to patient size and/or use of iterative reconstruction technique. COMPARISON:  Head CT 12/25/2023 FINDINGS: Brain: No intracranial hemorrhage, mass effect, or midline shift. No hydrocephalus. The basilar  cisterns are patent. No evidence of territorial infarct or acute ischemia. Enlarged partially empty sella. No extra-axial or intracranial fluid collection. Vascular: No hyperdense vessel or unexpected calcification. Skull: No fracture or focal lesion. Sinuses/Orbits: Again seen complete opacification of the left maxillary sinus with discontinuity of the medial wall. Maxillary sinus filling extends into the nasal cavity. No significant change from prior exam. No acute findings. Other: No scalp hematoma. IMPRESSION: 1. No acute intracranial abnormality. No skull fracture. 2. Enlarged partially empty sella, can be seen with idiopathic intracranial hypertension. 3. Unchanged left maxillary sinus opacification with discontinuity of the medial wall. Recommend elective ENT referral. Electronically Signed   By: Andrea Gasman M.D.   On: 04/26/2024 20:28   DG Hand Complete Right Result Date: 04/26/2024 CLINICAL DATA:  Pain after fall. EXAM: RIGHT HAND - COMPLETE 3+ VIEW COMPARISON:  None Available. FINDINGS: There is no evidence of fracture or dislocation. Alignment and joint spaces are normal. There  is no evidence of arthropathy or other focal bone abnormality. Soft tissues are unremarkable. IMPRESSION: Negative radiographs of the right hand. Electronically Signed   By: Andrea Gasman M.D.   On: 04/26/2024 18:04   DG Hip Unilat W or Wo Pelvis 2-3 Views Left Result Date: 04/26/2024 CLINICAL DATA:  Pain after fall. EXAM: DG HIP (WITH OR WITHOUT PELVIS) 2-3V LEFT COMPARISON:  None Available. FINDINGS: No acute fracture of the pelvis or left hip. No hip dislocation. Hip joint spaces preserved, the femoral head is well seated. The pubic rami are intact. No pubic symphyseal or sacroiliac diastasis. IMPRESSION: No fracture or dislocation of the pelvis or left hip. Electronically Signed   By: Andrea Gasman M.D.   On: 04/26/2024 18:03     Procedures   Medications Ordered in the ED  levETIRAcetam  (KEPPRA ) undiluted injection 1,250 mg (1,250 mg Intravenous Given [name] 1736)  lactated ringers  bolus 1,000 mL (0 mLs Intravenous Stopped [name] 2139)                                    Medical Decision Making Amount and/or Complexity of Data Reviewed Labs: ordered. Radiology: ordered.   48 year old female history of seizures on multiple medications who presents emergency department after a seizure  Initial Ddx:  Seizure, TBI, C-spine injury, hand fracture, hip fracture, electrolyte abnormality, malingering  MDM/Course:  Patient presents emergency department with seizure.  Apparently was being approached by police when this happened.  Does also report several other seizures in the past few days.  Says that she just picked up the prescriptions of her Depakote  and Keppra  but has not been able to get the Xcopri  yet due to issues with prior authorization.  On exam is not in acute distress.  Is complaining of neck pain and pain in her left hip and hand.  No focal neurologic deficits.  Was little bit tachycardic.  Was given some IV fluids and loaded with Keppra .  Her imaging did not show any  acute abnormality.  Lab work overall reassuring.  UA without evidence of UTI and alcohol and drug screen were negative aside from marijuana.  Upon re-evaluation she remains at her baseline.  Will have her follow-up with her neurologist and continue her Keppra  and Depakote  at home.  TOC consult placed for assistance with getting her Xcopri  filled.  Instructed not to drive for 6 months  This patient presents to the ED for concern of complaints listed in  HPI, this involves an extensive number of treatment options, and is a complaint that carries with it a high risk of complications and morbidity. Disposition including potential need for admission considered.   Dispo: DC Home. Return precautions discussed including, but not limited to, those listed in the AVS. Allowed pt time to ask questions which were answered fully prior to dc.  I have reviewed the patients home medications and made adjustments as needed Additional history obtained from EMS Records reviewed Outpatient Clinic Notes The following labs were independently interpreted: Chemistry and show no acute abnormality I independently reviewed the following imaging with scope of interpretation limited to determining acute life threatening conditions related to emergency care: CT Head and agree with the radiologist interpretation with the following exceptions: none I personally reviewed and interpreted cardiac monitoring: normal sinus rhythm  I personally reviewed and interpreted the pt's EKG: see above for interpretation  Consults: TOC  Portions of this [name] were generated with Dragon dictation software. Dictation errors may occur despite best attempts at proofreading.    Final diagnoses:  Seizure [facility])    ED Discharge Orders     None          Yvette Lamar BROCKS, Yvette [name] [name] 2149  "

## 2024-05-11 ENCOUNTER — Encounter (HOSPITAL_COMMUNITY): Payer: Self-pay

## 2024-05-11 ENCOUNTER — Other Ambulatory Visit: Payer: Self-pay

## 2024-05-11 ENCOUNTER — Encounter (HOSPITAL_COMMUNITY)
Admission: RE | Admit: 2024-05-11 | Discharge: 2024-05-11 | Disposition: A | Discharge status: Home / Self Care | Source: Ambulatory Visit | Attending: Surgery | Admitting: Surgery

## 2024-05-11 VITALS — BP 148/80 | HR 95 | Resp 18 | Ht 60.0 in | Wt 141.1 lb

## 2024-05-11 DIAGNOSIS — Z01818 Encounter for other preprocedural examination: Secondary | ICD-10-CM | POA: Insufficient documentation

## 2024-05-11 LAB — POCT PREGNANCY, URINE: Preg Test, Ur: NEGATIVE

## 2024-05-11 NOTE — Patient Instructions (Signed)
 "        Yvette [name]  05/11/2024     @PREFPERIOPPHARMACY @   Your procedure is scheduled on  05/13/2024.    Report to Yvette [name] at  0815 A.M.   Call this number if you have problems the morning of surgery:  [phone]  If you experience any cold or flu symptoms such as cough, fever, chills, shortness of breath, etc. between now and your scheduled surgery, please notify us  at the above number.   Remember:  Do not eat or drink after midnight.      Take these medicines the morning of surgery with A SIP OF WATER                             divaloprex, keppra , zofran  (if needed).    Do not wear jewelry, make-up or nail polish, including gel polish,  artificial nails, or any other type of covering on natural nails (fingers and  toes).  Do not wear lotions, powders, or perfumes, or deodorant.  Do not shave 48 hours prior to surgery.  Men may shave face and neck.  Do not bring valuables to the hospital.  [facility] is not responsible for any belongings or valuables.  Contacts, dentures or bridgework may not be worn into surgery.  Leave your suitcase in the car.  After surgery it may be brought to your room.  For patients admitted to the hospital, discharge time will be determined by your treatment team.  Patients discharged the day of surgery will not be allowed to drive home and must have someone with them for 24 hours.    Special instructions:  DO NOT smoke tobacco or vape for 24 hours before your procedure.  Please read over the following fact sheets that you were given. Coughing and Deep Breathing, Surgical Site Infection Prevention, Anesthesia Post-op Instructions, and Care and Recovery After Surgery       Minimally Invasive Cholecystectomy, Care After The following information offers guidance on how to care for yourself after your procedure. Your health care provider may also give you more specific instructions. If you have problems or questions, contact your  health care provider. What can I expect after the procedure? After the procedure, it is common to have: Pain at your incision sites. You will be given medicines to control this pain. Mild nausea or vomiting. Bloating and possible shoulder pain from the gas that was used during the procedure. Follow these instructions at home: Medicines Take over-the-counter and prescription medicines only as told by your health care provider. If you were prescribed an antibiotic medicine, take it as told by your health care provider. Do not stop using the antibiotic even if you start to feel better. Ask your health care provider if the medicine prescribed to you: Requires you to avoid driving or using machinery. Can cause constipation. You may need to take these actions to prevent or treat constipation: Drink enough fluid to keep your urine pale yellow. Take over-the-counter or prescription medicines. Eat foods that are high in fiber, such as beans, whole grains, and fresh fruits and vegetables. Limit foods that are high in fat and processed sugars, such as fried or sweet foods. Incision care  Follow instructions from your health care provider about how to take care of your incisions. Make sure you: Wash your hands with soap and water for at least 20 seconds before and after you change your bandage (dressing). If soap  and water are not available, use hand sanitizer. Change your dressing as told by your health care provider. Leave stitches (sutures), skin glue, or adhesive strips in place. These skin closures may need to be in place for 2 weeks or longer. If adhesive strip edges start to loosen and curl up, you may trim the loose edges. Do not remove adhesive strips completely unless your health care provider tells you to do that. Do not take baths, swim, or use a hot tub until your health care provider approves. Ask your health care provider if you may take showers. You may only be allowed to take sponge  baths. Check your incision area every day for signs of infection. Check for: More redness, swelling, or pain. Fluid or blood. Warmth. Pus or a bad smell. Activity Rest as told by your health care provider. Do not do activities that require a lot of effort. Avoid sitting for a long time without moving. Get up to take short walks every 1-2 hours. This is important to improve blood flow and breathing. Ask for help if you feel weak or unsteady. Do not lift anything that is heavier than 10 lb (4.5 kg), or the limit that you are told, until your health care provider says that it is safe. Do not play contact sports until your health care provider approves. Do not return to work or school until your health care provider approves. Return to your normal activities as told by your health care provider. Ask your health care provider what activities are safe for you. General instructions If you were given a sedative during the procedure, it can affect you for several hours. Do not drive or operate machinery until your health care provider says that it is safe. Keep all follow-up visits. This is important. Contact a health care provider if: You develop a rash. You have more redness, swelling, or pain around your incisions. You have fluid or blood coming from your incisions. Your incisions feel warm to the touch. You have pus or a bad smell coming from your incisions. You have a fever. One or more of your incisions breaks open. Get help right away if: You have trouble breathing. You have chest pain. You have more pain in your shoulders. You faint or feel dizzy when you stand. You have severe pain in your abdomen. You have nausea or vomiting that lasts for more than one day. You have leg pain that is new or unusual, or if it is localized to one specific spot. These symptoms may represent a serious problem that is an emergency. Do not wait to see if the symptoms will go away. Get medical help right  away. Call your local emergency services (911 in the [place] Do not drive yourself to the hospital. Summary After your procedure, it is common to have pain at the incision sites. You may also have nausea or bloating. Follow your health care provider's instructions about medicine, activity restrictions, and caring for your incision areas. Do not do activities that require a lot of effort. Contact a health care provider if you have a fever or other signs of infection, such as more redness, swelling, or pain around the incisions. Get help right away if you have chest pain, increasing pain in the shoulders, or trouble breathing. This information is not intended to replace advice given to you by your health care provider. Make sure you discuss any questions you have with your health care provider. Document Revised: 10/23/2020 Document Reviewed: 10/24/2020 Elsevier  Patient Education  2024 [employer] Inc.General Anesthesia, Adult, Care After The following information offers guidance on how to care for yourself after your procedure. Your health care provider may also give you more specific instructions. If you have problems or questions, contact your health care provider. What can I expect after the procedure? After the procedure, it is common for people to: Have pain or discomfort at the IV site. Have nausea or vomiting. Have a sore throat or hoarseness. Have trouble concentrating. Feel cold or chills. Feel weak, sleepy, or tired (fatigue). Have soreness and body aches. These can affect parts of the body that were not involved in surgery. Follow these instructions at home: For the time period you were told by your health care provider:  Rest. Do not participate in activities where you could fall or become injured. Do not drive or use machinery. Do not drink alcohol. Do not take sleeping pills or medicines that cause drowsiness. Do not make important decisions or sign legal documents. Do not take  care of children on your own. General instructions Drink enough fluid to keep your urine pale yellow. If you have sleep apnea, surgery and certain medicines can increase your risk for breathing problems. Follow instructions from your health care provider about wearing your sleep device: Anytime you are sleeping, including during daytime naps. While taking prescription pain medicines, sleeping medicines, or medicines that make you drowsy. Return to your normal activities as told by your health care provider. Ask your health care provider what activities are safe for you. Take over-the-counter and prescription medicines only as told by your health care provider. Do not use any products that contain nicotine or tobacco. These products include cigarettes, chewing tobacco, and vaping devices, such as e-cigarettes. These can delay incision healing after surgery. If you need help quitting, ask your health care provider. Contact a health care provider if: You have nausea or vomiting that does not get better with medicine. You vomit every time you eat or drink. You have pain that does not get better with medicine. You cannot urinate or have bloody urine. You develop a skin rash. You have a fever. Get help right away if: You have trouble breathing. You have chest pain. You vomit blood. These symptoms may be an emergency. Get help right away. Call 911. Do not wait to see if the symptoms will go away. Do not drive yourself to the hospital. Summary After the procedure, it is common to have a sore throat, hoarseness, nausea, vomiting, or to feel weak, sleepy, or fatigue. For the time period you were told by your health care provider, do not drive or use machinery. Get help right away if you have difficulty breathing, have chest pain, or vomit blood. These symptoms may be an emergency. This information is not intended to replace advice given to you by your health care provider. Make sure you discuss any  questions you have with your health care provider. Document Revised: 07/20/2021 Document Reviewed: 07/20/2021 Elsevier Patient Education  2024 Else[employer] Inc.How to Use Chlorhexidine at Home in the Shower Chlorhexidine gluconate (CHG) is a germ-killing (antiseptic) wash that's used to clean the skin. It can get rid of the germs that normally live on the skin and can keep them away for about 24 hours. If you're having surgery, you may be told to shower with CHG at home the night before surgery. This can help lower your risk for infection. To use CHG wash in the shower, follow the steps below. Supplies  needed: CHG body wash. Clean washcloth. Clean towel. How to use CHG in the shower Follow these steps unless you're told to use CHG in a different way: Start the shower. Use your normal soap and shampoo to wash your face and hair. Turn off the shower or move out of the shower stream. Pour CHG onto a clean washcloth. Do not use any type of brush or rough sponge. Start at your neck, washing your body down to your toes. Make sure you: Wash the part of your body where the surgery will be done for at least 1 minute. Do not scrub. Do not use CHG on your head or face unless your health care provider tells you to. If it gets into your ears or eyes, rinse them well with water. Do not wash your genitals with CHG. Wash your back and under your arms. Make sure to wash skin folds. Let the CHG sit on your skin for 1-2 minutes or as long as told. Rinse your entire body in the shower, including all body creases and folds. Turn off the shower. Dry off with a clean towel. Do not put anything on your skin afterward, such as powder, lotion, or perfume. Put on clean clothes or pajamas. If it's the night before surgery, sleep in clean sheets. General tips Use CHG only as told, and follow the instructions on the label. Use the full amount of CHG as told. This is often one bottle. Do not smoke and stay away from  flames after using CHG. Your skin may feel sticky after using CHG. This is normal. The sticky feeling will go away as the CHG dries. Do not use CHG: If you have a chlorhexidine allergy or have reacted to chlorhexidine in the past. On open wounds or areas of skin that have broken skin, cuts, or scrapes. On babies younger than 14 months of age. Contact a health care provider if: You have questions about using CHG. Your skin gets irritated or itchy. You have a rash after using CHG. You swallow any CHG. Call your local poison control center [phone] in the [place] Your eyes itch badly, or they become very red or swollen. Your hearing changes. You have trouble seeing. If you can't reach your provider, go to an urgent care or emergency room. Do not drive yourself. Get help right away if: You have swelling or tingling in your mouth or throat. You make high-pitched whistling sounds when you breathe, most often when you breathe out (wheeze). You have trouble breathing. These symptoms may be an emergency. Call 911 right away. Do not wait to see if the symptoms will go away. Do not drive yourself to the hospital. This information is not intended to replace advice given to you by your health care provider. Make sure you discuss any questions you have with your health care provider. Document Revised: 11/05/2022 Document Reviewed: 11/01/2021 Elsevier Patient Education  2024 [employer]. "

## 2024-05-11 NOTE — Pre-Procedure Instructions (Signed)
 Attempted pre-op phone call. Recording states, call cannot be completed.

## 2024-05-13 ENCOUNTER — Ambulatory Visit (HOSPITAL_COMMUNITY): Payer: Self-pay | Admitting: Certified Registered"

## 2024-05-13 ENCOUNTER — Encounter (HOSPITAL_COMMUNITY): Payer: Self-pay | Admitting: Surgery

## 2024-05-13 ENCOUNTER — Encounter (HOSPITAL_COMMUNITY)
Admission: RE | Disposition: A | Discharge status: Home / Self Care | Payer: Self-pay | Source: Home / Self Care | Attending: Surgery

## 2024-05-13 ENCOUNTER — Ambulatory Visit (HOSPITAL_COMMUNITY)
Admission: RE | Admit: 2024-05-13 | Discharge: 2024-05-13 | Disposition: A | Discharge status: Home / Self Care | Attending: Surgery | Admitting: Surgery

## 2024-05-13 DIAGNOSIS — K8020 Calculus of gallbladder without cholecystitis without obstruction: Secondary | ICD-10-CM | POA: Diagnosis present

## 2024-05-13 DIAGNOSIS — I2510 Atherosclerotic heart disease of native coronary artery without angina pectoris: Secondary | ICD-10-CM | POA: Insufficient documentation

## 2024-05-13 DIAGNOSIS — K8010 Calculus of gallbladder with chronic cholecystitis without obstruction: Secondary | ICD-10-CM | POA: Insufficient documentation

## 2024-05-13 DIAGNOSIS — Z8249 Family history of ischemic heart disease and other diseases of the circulatory system: Secondary | ICD-10-CM | POA: Diagnosis not present

## 2024-05-13 DIAGNOSIS — F1290 Cannabis use, unspecified, uncomplicated: Secondary | ICD-10-CM | POA: Diagnosis not present

## 2024-05-13 DIAGNOSIS — L720 Epidermal cyst: Secondary | ICD-10-CM | POA: Diagnosis not present

## 2024-05-13 DIAGNOSIS — I10 Essential (primary) hypertension: Secondary | ICD-10-CM | POA: Insufficient documentation

## 2024-05-13 DIAGNOSIS — R569 Unspecified convulsions: Secondary | ICD-10-CM | POA: Diagnosis not present

## 2024-05-13 DIAGNOSIS — J45909 Unspecified asthma, uncomplicated: Secondary | ICD-10-CM | POA: Diagnosis not present

## 2024-05-13 DIAGNOSIS — R2232 Localized swelling, mass and lump, left upper limb: Secondary | ICD-10-CM | POA: Diagnosis present

## 2024-05-13 HISTORY — PX: EXCISION OF BACK LESION: SHX6597

## 2024-05-13 SURGERY — CHOLECYSTECTOMY, ROBOT-ASSISTED, LAPAROSCOPIC
Anesthesia: General | Site: Back

## 2024-05-13 MED ORDER — HYDROMORPHONE HCL 1 MG/ML IJ SOLN
INTRAMUSCULAR | Status: DC | PRN
  Administered 2024-05-13: .5 mg via INTRAVENOUS

## 2024-05-13 MED ORDER — FENTANYL CITRATE (PF) 50 MCG/ML IJ SOSY
PREFILLED_SYRINGE | INTRAMUSCULAR | Status: AC
  Filled 2024-05-13: qty 1

## 2024-05-13 MED ORDER — STERILE WATER FOR IRRIGATION IR SOLN
Status: DC | PRN
  Administered 2024-05-13: 1000 mL

## 2024-05-13 MED ORDER — ONDANSETRON HCL 4 MG PO TABS: 4.0000 mg | ORAL_TABLET | Freq: Every day | ORAL | 1 refills | Status: DC | PRN

## 2024-05-13 MED ORDER — LIDOCAINE 2% (20 MG/ML) 5 ML SYRINGE
INTRAMUSCULAR | Status: AC
  Filled 2024-05-13: qty 5

## 2024-05-13 MED ORDER — CHLORHEXIDINE GLUCONATE CLOTH 2 % EX PADS: 6.0000 | MEDICATED_PAD | Freq: Once | CUTANEOUS | Status: DC

## 2024-05-13 MED ORDER — PROPOFOL 10 MG/ML IV BOLUS
INTRAVENOUS | Status: AC
  Filled 2024-05-13: qty 20

## 2024-05-13 MED ORDER — ONDANSETRON HCL 4 MG/2ML IJ SOLN: 4.0000 mg | Freq: Once | INTRAMUSCULAR | Status: DC | PRN

## 2024-05-13 MED ORDER — BUPIVACAINE HCL (PF) 0.5 % IJ SOLN
INTRAMUSCULAR | Status: AC
  Filled 2024-05-13: qty 30

## 2024-05-13 MED ORDER — ROCURONIUM BROMIDE 10 MG/ML (PF) SYRINGE
PREFILLED_SYRINGE | INTRAVENOUS | Status: DC | PRN
  Administered 2024-05-13: 100 mg via INTRAVENOUS
  Administered 2024-05-13: 20 mg via INTRAVENOUS

## 2024-05-13 MED ORDER — FENTANYL CITRATE (PF) 100 MCG/2ML IJ SOLN
INTRAMUSCULAR | Status: DC | PRN
  Administered 2024-05-13: 100 ug via INTRAVENOUS
  Administered 2024-05-13 (×2): 50 ug via INTRAVENOUS
  Administered 2024-05-13: 100 ug via INTRAVENOUS

## 2024-05-13 MED ORDER — DEXMEDETOMIDINE HCL IN NACL 80 MCG/20ML IV SOLN
INTRAVENOUS | Status: DC | PRN
  Administered 2024-05-13: 12 ug via INTRAVENOUS

## 2024-05-13 MED ORDER — MIDAZOLAM HCL (PF) 2 MG/2ML IJ SOLN
INTRAMUSCULAR | Status: DC | PRN
  Administered 2024-05-13: 2 mg via INTRAVENOUS

## 2024-05-13 MED ORDER — FENTANYL CITRATE (PF) 100 MCG/2ML IJ SOLN
INTRAMUSCULAR | Status: AC
  Filled 2024-05-13: qty 2

## 2024-05-13 MED ORDER — ONDANSETRON HCL 4 MG/2ML IJ SOLN
INTRAMUSCULAR | Status: DC | PRN
  Administered 2024-05-13: 4 mg via INTRAVENOUS

## 2024-05-13 MED ORDER — CHLORHEXIDINE GLUCONATE 0.12 % MT SOLN: 15.0000 mL | Freq: Once | OROMUCOSAL | Status: DC

## 2024-05-13 MED ORDER — INDOCYANINE GREEN 25 MG IJ SOLR
INTRAMUSCULAR | Status: AC
  Filled 2024-05-13: qty 10

## 2024-05-13 MED ORDER — FENTANYL CITRATE (PF) 50 MCG/ML IJ SOSY
25.0000 ug | PREFILLED_SYRINGE | INTRAMUSCULAR | Status: DC | PRN
  Administered 2024-05-13 (×2): 50 ug via INTRAVENOUS
  Filled 2024-05-13: qty 1

## 2024-05-13 MED ORDER — ONDANSETRON HCL 4 MG/2ML IJ SOLN
INTRAMUSCULAR | Status: AC
  Filled 2024-05-13: qty 2

## 2024-05-13 MED ORDER — LACTATED RINGERS IV SOLN: INTRAVENOUS | Status: DC | PRN

## 2024-05-13 MED ORDER — LACTATED RINGERS IV SOLN: INTRAVENOUS | Status: DC

## 2024-05-13 MED ORDER — OXYCODONE HCL 5 MG PO TABS: 5.0000 mg | ORAL_TABLET | Freq: Four times a day (QID) | ORAL | 0 refills | Status: DC | PRN

## 2024-05-13 MED ORDER — LIDOCAINE HCL (PF) 2 % IJ SOLN
INTRAMUSCULAR | Status: DC | PRN
  Administered 2024-05-13: 60 mg via INTRADERMAL

## 2024-05-13 MED ORDER — SUGAMMADEX SODIUM 200 MG/2ML IV SOLN
INTRAVENOUS | Status: DC | PRN
  Administered 2024-05-13: 130 mg via INTRAVENOUS

## 2024-05-13 MED ORDER — ROCURONIUM BROMIDE 10 MG/ML (PF) SYRINGE
PREFILLED_SYRINGE | INTRAVENOUS | Status: AC
  Filled 2024-05-13: qty 10

## 2024-05-13 MED ORDER — SODIUM CHLORIDE 0.9 % IV SOLN
2.0000 g | INTRAVENOUS | Status: AC
  Administered 2024-05-13: 2 g via INTRAVENOUS

## 2024-05-13 MED ORDER — PROPOFOL 10 MG/ML IV BOLUS
INTRAVENOUS | Status: DC | PRN
  Administered 2024-05-13: 60 mg via INTRAVENOUS
  Administered 2024-05-13: 140 mg via INTRAVENOUS

## 2024-05-13 MED ORDER — BUPIVACAINE HCL (PF) 0.5 % IJ SOLN
INTRAMUSCULAR | Status: DC | PRN
  Administered 2024-05-13: 30 mL

## 2024-05-13 MED ORDER — MIDAZOLAM HCL 2 MG/2ML IJ SOLN
INTRAMUSCULAR | Status: AC
  Filled 2024-05-13: qty 2

## 2024-05-13 MED ORDER — DOCUSATE SODIUM 100 MG PO CAPS: 100.0000 mg | ORAL_CAPSULE | Freq: Two times a day (BID) | ORAL | 2 refills | Status: DC

## 2024-05-13 MED ORDER — DEXAMETHASONE SOD PHOSPHATE PF 10 MG/ML IJ SOLN
INTRAMUSCULAR | Status: DC | PRN
  Administered 2024-05-13: 8 mg via INTRAVENOUS

## 2024-05-13 MED ORDER — INDOCYANINE GREEN 25 MG IJ SOLR
2.5000 mg | Freq: Once | INTRAMUSCULAR | Status: AC
  Administered 2024-05-13: 2.5 mg via INTRAVENOUS

## 2024-05-13 MED ORDER — PHENYLEPHRINE HCL (PRESSORS) 10 MG/ML IV SOLN
INTRAVENOUS | Status: DC | PRN
  Administered 2024-05-13: 160 ug via INTRAVENOUS
  Administered 2024-05-13 (×4): 80 ug via INTRAVENOUS
  Administered 2024-05-13: 40 ug via INTRAVENOUS

## 2024-05-13 MED ORDER — SODIUM CHLORIDE 0.9 % IV SOLN
INTRAVENOUS | Status: AC
  Filled 2024-05-13: qty 2

## 2024-05-13 MED ORDER — HYDROMORPHONE HCL 1 MG/ML IJ SOLN
INTRAMUSCULAR | Status: AC
  Filled 2024-05-13: qty 0.5

## 2024-05-13 MED ORDER — ORAL CARE MOUTH RINSE: 15.0000 mL | Freq: Once | OROMUCOSAL | Status: DC

## 2024-05-13 SURGICAL SUPPLY — 49 items
APPLICATOR CHLORAPREP 10.5 ORG (MISCELLANEOUS) ×2 IMPLANT
BLADE SURG 15 STRL LF DISP TIS (BLADE) ×2 IMPLANT
CAUTERY HOOK MNPLR 1.6 DVNC XI (INSTRUMENTS) ×2 IMPLANT
CHLORAPREP W/TINT 26 (MISCELLANEOUS) ×2 IMPLANT
CLIP LIGATING HEM O LOK PURPLE (MISCELLANEOUS) ×2 IMPLANT
COVER LIGHT HANDLE (MISCELLANEOUS) IMPLANT
DEFOGGER SCOPE WARM SEASHARP (MISCELLANEOUS) ×2 IMPLANT
DERMABOND ADVANCED .7 DNX12 (GAUZE/BANDAGES/DRESSINGS) ×2 IMPLANT
DRAPE ARM DVNC X/XI (DISPOSABLE) ×8 IMPLANT
DRAPE COLUMN DVNC XI (DISPOSABLE) ×2 IMPLANT
DRAPE LAPAROSCOPIC ABDOMINAL (DRAPES) IMPLANT
ELECT NEEDLE TIP 2.8 STRL (NEEDLE) IMPLANT
ELECTRODE REM PT RTRN 9FT ADLT (ELECTROSURGICAL) ×2 IMPLANT
FORCEPS BPLR R/ABLATION 8 DVNC (INSTRUMENTS) ×2 IMPLANT
FORCEPS PROGRASP DVNC XI (FORCEP) ×2 IMPLANT
GAUZE 4X4 16PLY ~~LOC~~+RFID DBL (SPONGE) IMPLANT
GAUZE SPONGE 4X4 12PLY STRL (GAUZE/BANDAGES/DRESSINGS) IMPLANT
GAUZE XEROFORM 1X8 LF (GAUZE/BANDAGES/DRESSINGS) IMPLANT
GLOVE BIO SURGEON STRL SZ8 (GLOVE) IMPLANT
GLOVE BIOGEL PI IND STRL 6.5 (GLOVE) ×4 IMPLANT
GLOVE BIOGEL PI IND STRL 7.0 (GLOVE) ×6 IMPLANT
GLOVE BIOGEL PI IND STRL 8 (GLOVE) IMPLANT
GLOVE SURG SS PI 6.5 STRL IVOR (GLOVE) ×4 IMPLANT
GOWN STRL REUS W/TWL LRG LVL3 (GOWN DISPOSABLE) ×6 IMPLANT
GOWN STRL REUS W/TWL XL LVL3 (GOWN DISPOSABLE) IMPLANT
GRASPER SUT TROCAR 14GX15 (MISCELLANEOUS) ×2 IMPLANT
KIT TURNOVER KIT A (KITS) ×2 IMPLANT
MANIFOLD NEPTUNE II (INSTRUMENTS) ×2 IMPLANT
NEEDLE HYPO 21X1.5 SAFETY (NEEDLE) ×2 IMPLANT
NEEDLE INSUFFLATION 14GA 120MM (NEEDLE) ×2 IMPLANT
OBTURATOR OPTICALSTD 8 DVNC (TROCAR) ×2 IMPLANT
PACK LAP CHOLE LZT030E (CUSTOM PROCEDURE TRAY) ×2 IMPLANT
PAD ARMBOARD POSITIONER FOAM (MISCELLANEOUS) ×2 IMPLANT
PENCIL HANDSWITCHING (ELECTRODE) ×2 IMPLANT
POSITIONER HEAD 8X9X4 ADT (SOFTGOODS) ×2 IMPLANT
SEAL UNIV 5-12 XI (MISCELLANEOUS) ×8 IMPLANT
SET BASIN LINEN APH (SET/KITS/TRAYS/PACK) ×2 IMPLANT
SET TUBE SMOKE EVAC HIGH FLOW (TUBING) ×2 IMPLANT
SOLN 0.9% NACL POUR BTL 1000ML (IV SOLUTION) ×2 IMPLANT
SOLN STERILE WATER 500 ML (IV SOLUTION) ×2 IMPLANT
SUT 3-0 BLK 1X30 PSL (SUTURE) IMPLANT
SUT MNCRL AB 4-0 PS2 18 (SUTURE) ×4 IMPLANT
SUT VIC AB 3-0 SH 27X BRD (SUTURE) IMPLANT
SUT VICRYL 0 UR6 27IN ABS (SUTURE) ×2 IMPLANT
SYR 30ML LL (SYRINGE) ×2 IMPLANT
SYR BULB IRRIG 60ML STRL (SYRINGE) IMPLANT
SYR CONTROL 10ML LL (SYRINGE) ×2 IMPLANT
SYSTEM RETRIEVL 5MM INZII UNIV (BASKET) ×2 IMPLANT
YANKAUER SUCT BULB TIP 10FT TU (MISCELLANEOUS) IMPLANT

## 2024-05-13 NOTE — Op Note (Signed)
 [facility] Operative Note  05/13/2024  Preoperative Diagnosis: Symptomatic Cholelithiasis, left upper back cyst   Postoperative Diagnosis: Same   Procedure(s) Performed: Robotic Assisted Laparoscopic Cholecystectomy; excision of left upper back cyst, 4 cm   Surgeon: Dorothyann Brittle, DO   Assistants: No qualified resident was available    Anesthesia: General endotracheal   Anesthesiologist: Kendell Yvonna PARAS, MD    Specimens: Gallbladder   Estimated Blood Loss: Minimal   Blood Replacement: None    Complications: None   Wound Class: Clean contaminated for both procedures   Operative Indications: The patient was found to have cholelithiasis on imaging and was symptomatic.  We discussed the risk of the procedure including but not limited to bleeding, infection, injury to the common bile duct, bile leak, need for further procedures, chance of subtotal cholecystectomy.  While in the operating room, she has also requested a back cyst be removed.  All risks and benefits of performing both procedures were discussed with the patient including pain, infection, bleeding, damage to the surrounding structures, and need for more procedures or surgery. The patient voiced understanding of the procedure, all questions were sought and answered, and consent was obtained.  Findings:  Chronically inflamed and mildly distended gallbladder Critical view of safety noted All clips intact at the end of the case Adequate hemostasis  4 cm epidermoid cyst of the left upper back   Procedure: Firefly was given in the preoperative area.  The patient was taken to the operating room and placed supine. General endotracheal anesthesia was induced. Intravenous antibiotics were administered per protocol.  Patient was then placed into a right lateral decubitus position.  The previously marked back cyst was prepped and draped in the usual sterile fashion.  A time-out was completed verifying  correct patient, procedure, site, positioning, and implant(s) and/or special equipment prior to beginning this procedure.  An elliptical incision was made overlying the mass. Using electrocautery, the dermis and subcutaneous tissues dissected around the mass. The mass was removed in its entirety and sent to pathology for evaluation. Hemostasis was achieved using electrocautery. The dermis was approximated using 3-0 Vicryls sutures. The skin was closed using 3-0 Nylon in a vertical mattress fashion. Xeroform, 4x4s, and Medipore tape were applied.   Patient was then placed back into a supine position.  An orogastric tube positioned to decompress the stomach. The abdomen was prepared and draped in the usual sterile fashion.   Veress needle was placed at the infraumbilical area and insufflation was started after confirming a positive saline drop test and no immediate increase in abdominal pressure.  After reaching 15 mm, the Veress needle was removed and a 8 mm port was placed via optiview technique infraumbilical, measuring 20 mm away from the suspected position of the gallbladder.  The abdomen was inspected and no abnormalities or injuries were found.  Under direct vision, ports were placed in the following locations in a semi curvilinear position around the target of the gallbladder: Two 8 mm ports on the patient's right each having 8cm clearance to the adjacent ports and one 8 mm port placed on the patient's left 8 cm from the umbilical port. Once ports were placed, the table was placed in the reverse Trendelenburg position with the right side up. The Xi platform was brought into the operative field and docked to the ports successfully.  An endoscope was placed through the umbilical port, prograsp through the most lateral right port, fenestrated bipolar to the port just right of the umbilicus,  and then a hook cautery in the left port.  The dome of the gallbladder was grasped with prograsp and retracted over  the dome of the liver. Adhesions between the gallbladder and omentum, duodenum and transverse colon were lysed via hook cautery. The infundibulum was grasped with the fenestrated grasper and retracted toward the right lower quadrant. This maneuver exposed Calots triangle. Firefly was used throughout the dissection to ensure safe visualization of the cystic duct.  The peritoneum overlying the gallbladder infundibulum was then dissected and the cystic duct and cystic artery identified.  Critical view of safety with the liver bed clearly visible behind the duct and artery with no additional structures noted.  The cystic duct and cystic artery were doubly clipped and divided close to the gallbladder.    The gallbladder was then dissected from its peritoneal and liver bed attachments by electrocautery. Hemostasis was checked prior to removing the hook cautery.  The Anton was undocked and moved out of the field.  A 5mm Endo Catch bag was then placed through the umbilical port and the gallbladder was removed.  The gallbladder was passed off the table as a specimen. There was no evidence of bleeding from the gallbladder fossa or cystic artery or leakage of the bile from the cystic duct stump. The umbilical port site closed with a 0 vicryl with a PMI needle.  The abdomen was desufflated and secondary trocars were removed under direct vision. No bleeding was noted. Incisions were localized with marcaine .  All skin incisions were closed with subcuticular sutures of 4-0 monocryl and dermabond.   Final inspection revealed acceptable hemostasis. All counts were correct at the end of the case. The patient was awakened from anesthesia and extubated without complication. The OG tube was removed.  The patient went to the PACU in stable condition.   Dorothyann Brittle, DO [facility] [address] [place], [place] [address] [phone] (office)

## 2024-05-13 NOTE — Transfer of Care (Signed)
 Immediate Anesthesia Transfer of Care Note  Patient: Yvette [name]  Procedure(s) Performed: CHOLECYSTECTOMY, ROBOT-ASSISTED, LAPAROSCOPIC (Abdomen) EXCISION, LESION, BACK (Left: Back)  Patient Location: PACU  Anesthesia Type:General  Level of Consciousness: awake, drowsy, and patient cooperative  Airway & Oxygen Therapy: Patient Spontanous Breathing and Patient connected to face mask oxygen  Post-op Assessment: Report given to RN, Post -op Vital signs reviewed and stable, and Patient moving all extremities  Post vital signs: stable  Last Vitals:  Vitals Value Taken Time  BP 152/97 05/13/24 13:15  Temp 36.7 C 05/13/24 13:11  Pulse 64 05/13/24 13:16  Resp 11 05/13/24 13:16  SpO2 100 % 05/13/24 13:16  Vitals shown include unfiled device data.  Last Pain:  Vitals:   05/13/24 1311  TempSrc:   PainSc: 0-No pain      Patients Stated Pain Goal: 8 (05/13/24 0901)  Complications: No notable events documented.

## 2024-05-13 NOTE — Discharge Instructions (Signed)
 Ambulatory Surgery Discharge Instructions  General Anesthesia or Sedation Do not drive or operate heavy machinery for 24 hours.  Do not consume alcohol, tranquilizers, sleeping medications, or any non-prescribed medications for 24 hours. Do not make important decisions or sign any important papers in the next 24 hours. You should have someone with you tonight at home.  Activity  You are advised to go directly home from the hospital.  Restrict your activities and rest for a day.  Resume light activity tomorrow. No heavy lifting over 10 lbs or strenuous exercise.  Fluids and Diet Begin with clear liquids, bouillon, dry toast, soda crackers.  If not nauseated, you may go to a regular diet when you desire.  Greasy and spicy foods are not advised.  Medications  If you have not had a bowel movement in 24 hours, take 2 tablespoons over the counter Milk of mag.             You May resume your blood thinners tomorrow (Aspirin, coumadin, or other).  You are being discharged with prescriptions for Opioid/Narcotic Medications: There are some specific considerations for these medications that you should know. Opioid Meds have risks & benefits. Addiction to these meds is always a concern with prolonged use Take medication only as directed Do not drive while taking narcotic pain medication Do not crush tablets or capsules Do not use a different container than medication was dispensed in Lock the container of medication in a cool, dry place out of reach of children and pets. Opioid medication can cause addiction Do not share with anyone else (this is a felony) Do not store medications for future use. Dispose of them properly.     Disposal:  Find a [place]  household drug take back site near you.  If you can't get to a drug take back site, use the recipe below as a last resort to dispose of expired, unused or unwanted drugs. Disposal  (Do not dispose chemotherapy drugs this way, talk to your  prescribing doctor instead.) Step 1: Mix drugs (do not crush) with dirt, kitty litter, or used coffee grounds and add a small amount of water  to dissolve any solid medications. Step 2: Seal drugs in plastic bag. Step 3: Place plastic bag in trash. Step 4: Take prescription container and scratch out personal information, then recycle or throw away.  Operative Site  You have a liquid bandage over your incisions, this will begin to flake off in about a week. Ok to english as a second language teacher. Keep wound clean and dry. No baths or swimming. No lifting more than 10 pounds.  You have external stitches at your back incision site.  These will be removed in office in 2 weeks.  Ok to [employer] of this area.  Keep clean and dry  Contact Information: If you have questions or concerns, please call our office, [phone], Monday- Thursday 8AM-5PM and Friday 8AM-12Noon.  If it is after hours or on the weekend, please call [facility] Main Number, [phone], and ask to speak to the surgeon on call for Dr. Evonnie at [facility].   SPECIFIC COMPLICATIONS TO WATCH FOR: Inability to urinate Fever over 101? F by mouth Nausea and vomiting lasting longer than 24 hours. Pain not relieved by medication ordered Swelling around the operative site Increased redness, warmth, hardness, around operative area Numbness, tingling, or cold fingers or toes Blood -soaked dressing, (small amounts of oozing may be normal) Increasing and progressive drainage from surgical area or exam site

## 2024-05-13 NOTE — Progress Notes (Signed)
 [facility]  Spoke with the patient's husband in the consultation room.  I explained that she tolerated the procedure without difficulty.  She has dissolvable stitches under the skin with overlying skin glue at her laparoscopic incision sites and external stitches at her back incision site.  This will flake off in 10 to 14 days.  I discharged her home with a prescription for narcotic pain medication that they should take as needed for pain.  If they take the narcotic pain medication, they should take a stool softener as well.  The patient will follow-up with me in 2 weeks.  All questions were answered to his expressed satisfaction.  Dorothyann Brittle, DO [facility] [address] [place], [place] [address] [phone] (office)

## 2024-05-13 NOTE — Anesthesia Procedure Notes (Signed)
 Procedure Name: Intubation Date/Time: 05/13/2024 10:37 AM  Performed by: Con Fitch, RNPre-anesthesia Checklist: Patient identified, Emergency Drugs available, Suction available and Patient being monitored Patient Re-evaluated:Patient Re-evaluated prior to induction Oxygen Delivery Method: Circle system utilized Preoxygenation: Pre-oxygenation with 100% oxygen Induction Type: IV induction Ventilation: Mask ventilation without difficulty Laryngoscope Size: Mac and 3 Grade View: Grade II Tube type: Oral Number of attempts: 1 Airway Equipment and Method: Stylet and Oral airway Placement Confirmation: ETT inserted through vocal cords under direct vision, positive ETCO2 and breath sounds checked- equal and bilateral Secured at: 21 cm Tube secured with: Tape Dental Injury: Teeth and Oropharynx as per pre-operative assessment  Comments: Dl by srna

## 2024-05-13 NOTE — H&P (Signed)
 [facility] History and Physical   Reason for Referral: Cholelithiasis, left upper back cyst Referring Physician: Lyndal Norrie, PA-C   Chief Complaint   New Patient (Initial Visit)        Yvette [name] is a 49 y.o. female.  HPI: Patient presents for evaluation of cholelithiasis.  Starting about 7 months ago, she began having right upper quadrant abdominal pain with eating.  She presented to the emergency department for a headache and was advised to follow-up with her primary care doctor regarding her abdominal pain.  Her primary care doctor obtained an ultrasound which demonstrated gallstones.  She confirms having nausea with these episodes but denies ever having episodes of vomiting.  She is currently having regular bowel movements.  She also complains of a left upper back cyst that she potentially would like to have removed.  She has a past medical history significant for seizures, asthma, and hypertension.  She follows with neurology for her seizures, and her last seizure was 4 days ago.  She is on numerous medications to control her seizures.  She denies use of blood thinning medications.  She has had 3 C-sections in the past.  She smokes marijuana daily for her nausea.  She denies use of tobacco products and alcohol.       Past Medical History:  Diagnosis Date   Anemia     Asthma     Coronary artery disease     Hypertension     Seizures (HCC)                 Past Surgical History:  Procedure Laterality Date   CAROTID STENT        placed in 2019   CESAREAN SECTION        [id number], 2004   TUBAL LIGATION                   Family History  Problem Relation Age of Onset   Diabetes Maternal Grandmother     Hypertension Father     Diabetes Father     Other Brother          open heart surgery   Anemia Daughter            Social History  Social History         Tobacco Use   Smoking status: Never   Smokeless tobacco: Never  Vaping Use    Vaping status: Never Used  Substance Use Topics   Alcohol use: Not Currently   Drug use: Yes      Types: Marijuana      Comment: once a night        Medications: I have reviewed the patient's current medications. Allergies as of 03/11/2024         Reactions    Acetaminophen Shortness Of Breath    Lisinopril  Cough    Percocet [oxycodone -acetaminophen] Shortness Of Breath    Tachycardia, sob            Medication List           Accurate as of March 11, 2024  2:22 PM. If you have any questions, ask your nurse or doctor.              albuterol 108 (90 Base) MCG/ACT inhaler Commonly known as: VENTOLIN HFA Inhale 1-2 puffs into the lungs every 6 (six) hours as needed for wheezing or shortness of breath.    Cenobamate  14 x 12.5 MG & 14 x  25 MG Tbpk Take 12.5 mg daily for 14 days and then increase to 25 mg daily for 14 days.    divalproex  500 MG DR tablet Commonly known as: DEPAKOTE  Take 1 tablet (500 mg total) by mouth 2 (two) times daily.    levETIRAcetam  750 MG tablet Commonly known as: KEPPRA  Take 2 tablets (1,500 mg total) by mouth 2 (two) times daily.    losartan 100 MG tablet Commonly known as: COZAAR TAKE 1 TABLET BY MOUTH ONCE DAILY AS DIRECTED FOR BLOOD PRESSURE    naproxen  500 MG tablet Commonly known as: NAPROSYN  Take 1 tablet (500 mg total) by mouth 2 (two) times daily.    ondansetron  4 MG disintegrating tablet Commonly known as: ZOFRAN -ODT Take 1 tablet (4 mg total) by mouth every 8 (eight) hours as needed for nausea or vomiting.    Symbicort 80-4.5 MCG/ACT inhaler Generic drug: budesonide-formoterol Inhale 2 puffs into the lungs in the morning and at bedtime.               ROS:  Constitutional: negative for chills, fatigue, and fevers Eyes: positive for visual disturbance, negative for pain Ears, nose, mouth, throat, and face: positive for sinus problems, negative for ear drainage and sore throat Respiratory: negative for cough, wheezing,  and shortness of breath Cardiovascular: negative for chest pain and palpitations Gastrointestinal: positive for abdominal pain and nausea, negative for reflux symptoms and vomiting Genitourinary:negative for dysuria and frequency Integument/breast: negative for dryness and rash Hematologic/lymphatic: negative for bleeding and lymphadenopathy Musculoskeletal:negative for back pain and neck pain Neurological: negative for dizziness and tremors Endocrine: negative for temperature intolerance   Blood pressure (!) 146/86, pulse 68, temperature 98.2 F (36.8 C), temperature source Oral, resp. rate 14, height 5' (1.524 m), weight 141 lb (64 kg), last menstrual period 02/23/2020, SpO2 95%. Physical Exam Vitals reviewed.  Constitutional:      Appearance: Normal appearance.  HENT:     Head: Normocephalic and atraumatic.  Eyes:     Extraocular Movements: Extraocular movements intact.     Pupils: Pupils are equal, round, and reactive to light.  Cardiovascular:     Rate and Rhythm: Normal rate and regular rhythm.  Pulmonary:     Effort: Pulmonary effort is normal.     Breath sounds: Normal breath sounds.  Abdominal:     Comments: Abdomen soft, nondistended, no percussion tenderness, TTP in RUQ; no rigidity, guarding, or rebound tenderness; negative Murphy's sign  Musculoskeletal:        General: Normal range of motion.     Cervical back: Normal range of motion.  Skin:    General: Skin is warm and dry.     Comments: 2.5 cm left upper back cyst without induration, erythema, or tenderness  Neurological:     General: No focal deficit present.     Mental Status: She is alert and oriented to person, place, and time.  Psychiatric:        Mood and Affect: Mood normal.        Behavior: Behavior normal.       Results: Abdominal ultrasound (02/27/24): IMPRESSION: 1. cholelithiasis with largest stone 2.2 cm, borderline wall thickening, and trace pericholecystic fluid; no sonographic Murphy sign  to suggest acute cholecystitis. 2. Right renal cortical scarring with multiple small nonobstructive calyceal calculi up to 3 mm.   Assessment & Plan:  Yvette [name] is a 49 y.o. female who presents for evaluation of cholelithiasis.   -We discussed the pathophysiology of cholelithiasis and the recommendations  for cholecystectomy.  We also discussed sebaceous cysts -I counseled the patient about the indication, risks and benefits of robotic assisted laparoscopic cholecystectomy.  She understands there is a very small chance for bleeding, infection, injury to normal structures (including common bile duct), conversion to open surgery, persistent symptoms, evolution of postcholecystectomy diarrhea, need for secondary interventions, anesthesia reaction, cardiopulmonary issues and other risks not specifically detailed here. I described the expected recovery, the plan for follow-up and the restrictions during the recovery phase.  All questions were answered. -I discussed with the patient that we can also plan to excise her left upper back cyst at the time of cholecystectomy.  The risk of this procedure were discussed with the patient including but not limited to bleeding, infection, injury to surrounding structures, need for additional procedures, and cyst recurrence. -Patient was cleared for surgery from a neurology standpoint -Plan for surgery on 05/14/23 -Information provided to the patient regarding cholelithiasis, cholecystitis, cholecystectomy, and low fat diet -Advised patient to present to the ED if she begins to have worsening RUQ abdominal pain, nausea, vomiting, jaundice, fever or chills   All questions were answered to the satisfaction of the patient and family.   Note: Portions of this report may have been transcribed using voice recognition software. Every effort has been made to ensure accuracy; however, inadvertent computerized transcription errors may still be present.    Dorothyann Brittle, DO [facility] [address] [place], [place] [address] [phone] (office)

## 2024-05-13 NOTE — Anesthesia Preprocedure Evaluation (Signed)
"                                    Anesthesia Evaluation  Patient identified by MRN, date of birth, ID band Patient awake    Reviewed: Allergy & Precautions, H&P , NPO status , Patient's Chart, lab work & pertinent test results, reviewed documented beta blocker date and time   Airway Mallampati: II  TM Distance: >3 FB Neck ROM: full    Dental no notable dental hx.    Pulmonary asthma    Pulmonary exam normal breath sounds clear to auscultation       Cardiovascular Exercise Tolerance: Good hypertension, + CAD   Rhythm:regular Rate:Normal     Neuro/Psych Seizures -,   negative psych ROS   GI/Hepatic negative GI ROS, Neg liver ROS,,,  Endo/Other  negative endocrine ROS    Renal/GU negative Renal ROS  negative genitourinary   Musculoskeletal   Abdominal   Peds  Hematology  (+) Blood dyscrasia, anemia   Anesthesia Other Findings   Reproductive/Obstetrics negative OB ROS                              Anesthesia Physical Anesthesia Plan  ASA: 3  Anesthesia Plan: MAC   Post-op Pain Management:    Induction:   PONV Risk Score and Plan: Propofol  infusion  Airway Management Planned:   Additional Equipment:   Intra-op Plan:   Post-operative Plan:   Informed Consent: I have reviewed the patients History and Physical, chart, labs and discussed the procedure including the risks, benefits and alternatives for the proposed anesthesia with the patient or authorized representative who has indicated his/her understanding and acceptance.     Dental Advisory Given  Plan Discussed with: CRNA  Anesthesia Plan Comments:         Anesthesia Quick Evaluation  "

## 2024-05-14 ENCOUNTER — Encounter (HOSPITAL_COMMUNITY): Payer: Self-pay | Admitting: Surgery

## 2024-05-14 DIAGNOSIS — L720 Epidermal cyst: Secondary | ICD-10-CM

## 2024-05-14 LAB — SURGICAL PATHOLOGY

## 2024-05-15 NOTE — Anesthesia Postprocedure Evaluation (Signed)
"   Anesthesia Post Note  Patient: Yvette [name]  Procedure(s) Performed: CHOLECYSTECTOMY, ROBOT-ASSISTED, LAPAROSCOPIC (Abdomen) EXCISION, LESION, BACK (Left: Back)  Patient location during evaluation: Phase II Anesthesia Type: General Level of consciousness: awake Pain management: pain level controlled Vital Signs Assessment: post-procedure vital signs reviewed and stable Respiratory status: spontaneous breathing and respiratory function stable Cardiovascular status: blood pressure returned to baseline and stable Postop Assessment: no headache and no apparent nausea or vomiting Anesthetic complications: no Comments: Late entry   No notable events documented.   Last Vitals:  Vitals:   05/13/24 1430 05/13/24 1444  BP: 137/87 132/89  Pulse: 71 81  Resp: 16 17  Temp:  37.2 C  SpO2: 100% 100%    Last Pain:  Vitals:   05/13/24 1444  TempSrc: Oral  PainSc: 6                  [name]      "

## 2024-05-26 ENCOUNTER — Ambulatory Visit: Admitting: Surgery

## 2024-05-26 ENCOUNTER — Other Ambulatory Visit: Payer: Self-pay

## 2024-05-26 ENCOUNTER — Encounter: Payer: Self-pay | Admitting: Surgery

## 2024-05-26 VITALS — BP 105/74 | HR 73 | Temp 97.8°F | Resp 20 | Ht 60.0 in | Wt 135.0 lb

## 2024-05-26 DIAGNOSIS — Z09 Encounter for follow-up examination after completed treatment for conditions other than malignant neoplasm: Secondary | ICD-10-CM

## 2024-05-26 NOTE — Progress Notes (Unsigned)
"      [facility] Note   HPI:  49 y.o. Female presents to clinic for post-op follow-up evaluation status post robotic assisted laparoscopic cholecystectomy and left shoulder cyst excision on 05/13/2024.  Patient states that she has been doing well since the surgery.  She has occasional pains in her right lateral abdomen at her incision site, for which she has been taking some Tylenol.  She is tolerating a diet without nausea and vomiting, and having soft bowel movements.  She also denies any issues at her cyst excision site.  Denies fevers and chills.  Review of Systems:  All other review of systems: otherwise negative   Vital Signs:  BP 105/74 (BP Location: Left Arm, Patient Position: Sitting, Cuff Size: Normal)   Pulse 73   Temp 97.8 F (36.6 C) (Oral)   Resp 20   Ht 5' (1.524 m)   Wt 135 lb (61.2 kg)   LMP 02/23/2020 Comment: tubal ligation, no period in 3 months.  SpO2 98%   BMI 26.37 kg/m    Physical Exam:  Physical Exam Vitals reviewed.  Constitutional:      Appearance: Normal appearance.  Abdominal:     Comments: Abdomen soft, nondistended, no percussion tenderness, minimal incisional tenderness to palpation; no rigidity, guarding, rebound tenderness; incision sites well-healed, with small amount of glue in place  Skin:    General: Skin is warm and dry.     Comments: Left shoulder cyst excision site healing well with scab in the middle of the incision, no surrounding erythema, induration, or tenderness  Neurological:     Mental Status: She is alert.     Laboratory studies: None   Imaging:  None  Pathology:  A. CYST, LEFT UPPER BACK, EXCISION:  - Epidermal inclusion cyst   B. GALLBLADDER, CHOLECYSTECTOMY:  - Chronic cholecystitis with cholelithiasis  - One benign lymph node   Assessment:  49 y.o. yo Female who presents for follow-up status post robotic assisted laparoscopic cholecystectomy and left shoulder cyst excision on 05/13/2024.  Plan:  -  Patient overall doing well since her surgeries.  Tolerating a diet, moving her bowels, and pain well-controlled - Sutures removed at the back cyst site.  Steri-Strips applied.  Advised that these would fall off in the next 2 to 5 days - We discussed that if the scab at her incision site opens up, she could have some serous drainage from the incision.  Advised if she has any concerns about her incision, she can follow-up with - Advised that she can also use antibiotic ointment or Vaseline at her laparoscopic incision sites to help get the remaining skin glue off - Follow up with me as needed  All of the above recommendations were discussed with the patient, and all of patient's questions were answered to her expressed satisfaction.  Note: Portions of this report may have been transcribed using voice recognition software. Every effort has been made to ensure accuracy; however, inadvertent computerized transcription errors may still be present.   Dorothyann Brittle, DO [facility] [address] [place], [place] [address] [phone] (office) "

## 2024-06-07 ENCOUNTER — Encounter (HOSPITAL_COMMUNITY): Payer: Self-pay

## 2024-06-07 ENCOUNTER — Emergency Department (HOSPITAL_COMMUNITY)
Admission: EM | Admit: 2024-06-07 | Discharge: 2024-06-08 | Disposition: A | Attending: Emergency Medicine | Admitting: Emergency Medicine

## 2024-06-07 ENCOUNTER — Other Ambulatory Visit: Payer: Self-pay

## 2024-06-07 DIAGNOSIS — R112 Nausea with vomiting, unspecified: Secondary | ICD-10-CM | POA: Insufficient documentation

## 2024-06-07 DIAGNOSIS — R519 Headache, unspecified: Secondary | ICD-10-CM | POA: Insufficient documentation

## 2024-06-07 DIAGNOSIS — R531 Weakness: Secondary | ICD-10-CM | POA: Insufficient documentation

## 2024-06-07 DIAGNOSIS — R110 Nausea: Secondary | ICD-10-CM

## 2024-06-07 MED ORDER — ONDANSETRON HCL 4 MG/2ML IJ SOLN
4.0000 mg | Freq: Once | INTRAMUSCULAR | Status: AC
  Administered 2024-06-08: 4 mg via INTRAVENOUS
  Filled 2024-06-07: qty 2

## 2024-06-07 MED ORDER — LEVETIRACETAM (KEPPRA) 500 MG/5 ML ADULT IV PUSH
1500.0000 mg | Freq: Once | INTRAVENOUS | Status: AC
  Administered 2024-06-08: 1500 mg via INTRAVENOUS
  Filled 2024-06-07: qty 15

## 2024-06-07 MED ORDER — VALPROATE SODIUM 100 MG/ML IV SOLN
500.0000 mg | Freq: Once | INTRAVENOUS | Status: AC
  Administered 2024-06-08: 500 mg via INTRAVENOUS
  Filled 2024-06-07: qty 5

## 2024-06-07 MED ORDER — LORAZEPAM 2 MG/ML IJ SOLN
2.0000 mg | Freq: Once | INTRAMUSCULAR | Status: AC
  Administered 2024-06-07: 2 mg via INTRAMUSCULAR
  Filled 2024-06-07: qty 1

## 2024-06-08 ENCOUNTER — Emergency Department (HOSPITAL_COMMUNITY)

## 2024-06-08 LAB — CBC WITH DIFFERENTIAL/PLATELET
Abs Immature Granulocytes: 0.05 10*3/uL (ref 0.00–0.07)
Basophils Absolute: 0 10*3/uL (ref 0.0–0.1)
Basophils Relative: 1 %
Eosinophils Absolute: 0.1 10*3/uL (ref 0.0–0.5)
Eosinophils Relative: 1 %
HCT: 42.9 % (ref 36.0–46.0)
Hemoglobin: 14.1 g/dL (ref 12.0–15.0)
Immature Granulocytes: 1 %
Lymphocytes Relative: 18 %
Lymphs Abs: 1.6 10*3/uL (ref 0.7–4.0)
MCH: 31 pg (ref 26.0–34.0)
MCHC: 32.9 g/dL (ref 30.0–36.0)
MCV: 94.3 fL (ref 80.0–100.0)
Monocytes Absolute: 0.4 10*3/uL (ref 0.1–1.0)
Monocytes Relative: 5 %
Neutro Abs: 6.4 10*3/uL (ref 1.7–7.7)
Neutrophils Relative %: 74 %
Platelets: 219 10*3/uL (ref 150–400)
RBC: 4.55 MIL/uL (ref 3.87–5.11)
RDW: 13.1 % (ref 11.5–15.5)
WBC: 8.5 10*3/uL (ref 4.0–10.5)
nRBC: 0 % (ref 0.0–0.2)

## 2024-06-08 LAB — COMPREHENSIVE METABOLIC PANEL WITH GFR
ALT: 13 U/L (ref 0–44)
AST: 21 U/L (ref 15–41)
Albumin: 4.4 g/dL (ref 3.5–5.0)
Alkaline Phosphatase: 79 U/L (ref 38–126)
Anion gap: 11 (ref 5–15)
BUN: 11 mg/dL (ref 6–20)
CO2: 26 mmol/L (ref 22–32)
Calcium: 9.4 mg/dL (ref 8.9–10.3)
Chloride: 101 mmol/L (ref 98–111)
Creatinine, Ser: 0.9 mg/dL (ref 0.44–1.00)
GFR, Estimated: >60 mL/min
Glucose, Bld: 158 mg/dL — ABNORMAL HIGH (ref 70–99)
Potassium: 4 mmol/L (ref 3.5–5.1)
Sodium: 138 mmol/L (ref 135–145)
Total Bilirubin: 0.3 mg/dL (ref 0.0–1.2)
Total Protein: 7.6 g/dL (ref 6.5–8.1)

## 2024-06-08 LAB — URINALYSIS, W/ REFLEX TO CULTURE (INFECTION SUSPECTED)
Bacteria, UA: NONE SEEN
Bilirubin Urine: NEGATIVE
Glucose, UA: NEGATIVE mg/dL
Hgb urine dipstick: NEGATIVE
Ketones, ur: NEGATIVE mg/dL
Leukocytes,Ua: NEGATIVE
Nitrite: NEGATIVE
Protein, ur: NEGATIVE mg/dL
Specific Gravity, Urine: 1.025 (ref 1.005–1.030)
pH: 8 (ref 5.0–8.0)

## 2024-06-08 LAB — LIPASE, BLOOD: Lipase: 116 U/L — ABNORMAL HIGH (ref 11–51)

## 2024-06-08 MED ORDER — IOHEXOL 300 MG/ML  SOLN
100.0000 mL | Freq: Once | INTRAMUSCULAR | Status: AC | PRN
  Administered 2024-06-08: 100 mL via INTRAVENOUS

## 2024-06-08 MED ORDER — LACTATED RINGERS IV BOLUS
1000.0000 mL | Freq: Once | INTRAVENOUS | Status: AC
  Administered 2024-06-08: 1000 mL via INTRAVENOUS

## 2024-06-08 NOTE — Discharge Instructions (Addendum)
 Please ensure you get your prescriptions picked up today to avoid prolonging symptoms or having breakthrough seizures.

## 2024-08-11 ENCOUNTER — Ambulatory Visit: Admitting: Neurology
